# Patient Record
Sex: Female | Born: 1990 | Hispanic: No | Marital: Married | Smoking: Former smoker
Health system: Southern US, Community
[De-identification: ages and names within clinical notes are randomized; demographics above are authoritative.]

## PROBLEM LIST (undated history)

## (undated) ENCOUNTER — Inpatient Hospital Stay (HOSPITAL_COMMUNITY): Payer: Self-pay

## (undated) DIAGNOSIS — F419 Anxiety disorder, unspecified: Secondary | ICD-10-CM

## (undated) DIAGNOSIS — T7840XA Allergy, unspecified, initial encounter: Secondary | ICD-10-CM

## (undated) HISTORY — DX: Allergy, unspecified, initial encounter: T78.40XA

## (undated) HISTORY — PX: OTHER SURGICAL HISTORY: SHX169

## (undated) HISTORY — DX: Anxiety disorder, unspecified: F41.9

## (undated) HISTORY — PX: TONSILLECTOMY: SUR1361

---
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
## 2013-02-08 ENCOUNTER — Ambulatory Visit: Payer: Self-pay | Admitting: Emergency Medicine

## 2013-02-08 VITALS — BP 116/76 | HR 92 | Temp 99.3°F | Resp 18 | Ht 63.5 in | Wt 149.0 lb

## 2013-02-08 DIAGNOSIS — R112 Nausea with vomiting, unspecified: Secondary | ICD-10-CM

## 2013-02-08 LAB — POCT CBC
Granulocyte percent: 66.1 %G (ref 37–80)
HCT, POC: 38.2 % (ref 37.7–47.9)
Hemoglobin: 11.7 g/dL — AB (ref 12.2–16.2)
Lymph, poc: 2.1 (ref 0.6–3.4)
MCH, POC: 25 pg — AB (ref 27–31.2)
MCHC: 30.6 g/dL — AB (ref 31.8–35.4)
MCV: 81.7 fL (ref 80–97)
MID (cbc): 0.4 (ref 0–0.9)
MPV: 9.5 fL (ref 0–99.8)
POC Granulocyte: 4.9 (ref 2–6.9)
POC LYMPH PERCENT: 28.8 %L (ref 10–50)
POC MID %: 5.1 %M (ref 0–12)
Platelet Count, POC: 312 10*3/uL (ref 142–424)
RBC: 4.68 M/uL (ref 4.04–5.48)
RDW, POC: 13.9 %
WBC: 7.4 10*3/uL (ref 4.6–10.2)

## 2013-02-08 LAB — COMPREHENSIVE METABOLIC PANEL
ALT: 11 U/L (ref 0–35)
AST: 15 U/L (ref 0–37)
Albumin: 4.8 g/dL (ref 3.5–5.2)
Alkaline Phosphatase: 65 U/L (ref 39–117)
BUN: 9 mg/dL (ref 6–23)
CO2: 26 mEq/L (ref 19–32)
Calcium: 9.9 mg/dL (ref 8.4–10.5)
Chloride: 105 mEq/L (ref 96–112)
Creat: 0.66 mg/dL (ref 0.50–1.10)
Glucose, Bld: 88 mg/dL (ref 70–99)
Potassium: 4.2 mEq/L (ref 3.5–5.3)
Sodium: 137 mEq/L (ref 135–145)
Total Bilirubin: 1.1 mg/dL (ref 0.3–1.2)
Total Protein: 7.9 g/dL (ref 6.0–8.3)

## 2013-02-08 LAB — POCT URINALYSIS DIPSTICK
Bilirubin, UA: NEGATIVE
Blood, UA: NEGATIVE
Glucose, UA: NEGATIVE
Ketones, UA: NEGATIVE
Leukocytes, UA: NEGATIVE
Nitrite, UA: NEGATIVE
Protein, UA: NEGATIVE
Spec Grav, UA: 1.02
Urobilinogen, UA: 0.2
pH, UA: 8

## 2013-02-08 MED ORDER — ONDANSETRON 8 MG PO TBDP: 8.0000 mg | ORAL_TABLET | Freq: Three times a day (TID) | ORAL | Status: DC | PRN

## 2013-02-08 NOTE — Patient Instructions (Addendum)

## 2013-02-08 NOTE — Progress Notes (Signed)
[facility] [address], [place] [address] [id number]  Date:  02/08/2013   Name:  Emily Wilkins   DOB:  1991-07-08   MRN:  [id number]  PCP:  No PCP Per Patient    Chief Complaint: Nausea and Generalized Body Aches   History of Present Illness:  Emily Wilkins is a 22 y.o. very pleasant female patient who presents with the following:  Ill for two days with RUQ pain and nausea.  Vomited once. No food intolerance.  No diarrhea.  No dysuria, urgency or frequency.  No GYN symptoms.  Not sexually active.  No fever or chills. No food intolerance.  No improvement with over the counter medications or other home remedies. Denies other complaint or health concern today.   There are no active problems to display for this patient.   Past Medical History  Diagnosis Date  . Allergy     Past Surgical History  Procedure Laterality Date  . Tonsilect      History  Substance Use Topics  . Smoking status: Never Smoker   . Smokeless tobacco: Not on file  . Alcohol Use: No    History reviewed. No pertinent family history.  No Known Allergies  Medication list has been reviewed and updated.  No current outpatient prescriptions on file prior to visit.   No current facility-administered medications on file prior to visit.    Review of Systems:  As per HPI, otherwise negative.    Physical Examination: Filed Vitals:   02/08/13 1543  BP: 116/76  Pulse: 92  Temp: 99.3 F (37.4 C)  Resp: 18   Filed Vitals:   02/08/13 1543  Height: 5' 3.5" (1.613 m)  Weight: 149 lb (67.586 kg)   Body mass index is 25.98 kg/(m^2). Ideal Body Weight: Weight in (lb) to have BMI = 25: 143.1  GEN: WDWN, NAD, Non-toxic, A & O x 3  Not icteric HEENT: Atraumatic, Normocephalic. Neck supple. No masses, No LAD. Ears and Nose: No external deformity. CV: RRR, No M/G/R. No JVD. No thrill. No extra heart sounds. PULM: CTA B, no wheezes, crackles, rhonchi. No retractions. No resp.  distress. No accessory muscle use. ABD: S, mild RUQ tenderness, ND, +BS. No rebound. No HSM. EXTR: No c/c/e NEURO Normal gait.  PSYCH: Normally interactive. Conversant. Not depressed or anxious appearing.  Calm demeanor.    Assessment and Plan: Nausea and vomiting.  zofran Labs   Signed,  Phillips Odor, MD   Results for orders placed in visit on 02/08/13  POCT CBC      Result Value Range   WBC 7.4  4.6 - 10.2 K/uL   Lymph, poc 2.1  0.6 - 3.4   POC LYMPH PERCENT 28.8  10 - 50 %L   MID (cbc) 0.4  0 - 0.9   POC MID % 5.1  0 - 12 %M   POC Granulocyte 4.9  2 - 6.9   Granulocyte percent 66.1  37 - 80 %G   RBC 4.68  4.04 - 5.48 M/uL   Hemoglobin 11.7 (*) 12.2 - 16.2 g/dL   HCT, POC 64.4  03.4 - 47.9 %   MCV 81.7  80 - 97 fL   MCH, POC 25.0 (*) 27 - 31.2 pg   MCHC 30.6 (*) 31.8 - 35.4 g/dL   RDW, POC 74.2     Platelet Count, POC 312  142 - 424 K/uL   MPV 9.5  0 - 99.8 fL  POCT URINALYSIS  DIPSTICK      Result Value Range   Color, UA yellow     Clarity, UA clear     Glucose, UA neg     Bilirubin, UA neg     Ketones, UA neg     Spec Grav, UA 1.020     Blood, UA neg     pH, UA 8.0     Protein, UA neg     Urobilinogen, UA 0.2     Nitrite, UA neg     Leukocytes, UA Negative

## 2013-06-20 ENCOUNTER — Emergency Department (HOSPITAL_COMMUNITY): Payer: Self-pay

## 2013-06-20 ENCOUNTER — Emergency Department (HOSPITAL_COMMUNITY)
Admission: EM | Admit: 2013-06-20 | Discharge: 2013-06-20 | Disposition: A | Discharge status: Home / Self Care | Payer: Self-pay | Attending: Emergency Medicine | Admitting: Emergency Medicine

## 2013-06-20 DIAGNOSIS — Z3202 Encounter for pregnancy test, result negative: Secondary | ICD-10-CM | POA: Insufficient documentation

## 2013-06-20 DIAGNOSIS — R42 Dizziness and giddiness: Secondary | ICD-10-CM | POA: Insufficient documentation

## 2013-06-20 DIAGNOSIS — N83209 Unspecified ovarian cyst, unspecified side: Secondary | ICD-10-CM | POA: Insufficient documentation

## 2013-06-20 LAB — CBC WITH DIFFERENTIAL/PLATELET
Basophils Relative: 0 % (ref 0–1)
Eosinophils Absolute: 0 10*3/uL (ref 0.0–0.7)
Eosinophils Relative: 0 % (ref 0–5)
Hemoglobin: 11.5 g/dL — ABNORMAL LOW (ref 12.0–15.0)
Lymphs Abs: 2.2 10*3/uL (ref 0.7–4.0)
MCH: 24.8 pg — ABNORMAL LOW (ref 26.0–34.0)
MCHC: 32.7 g/dL (ref 30.0–36.0)
MCV: 76 fL — ABNORMAL LOW (ref 78.0–100.0)
Monocytes Relative: 5 % (ref 3–12)
Neutrophils Relative %: 64 % (ref 43–77)

## 2013-06-20 LAB — COMPREHENSIVE METABOLIC PANEL
Albumin: 4.3 g/dL (ref 3.5–5.2)
Alkaline Phosphatase: 60 U/L (ref 39–117)
BUN: 11 mg/dL (ref 6–23)
CO2: 26 mEq/L (ref 19–32)
Calcium: 9.7 mg/dL (ref 8.4–10.5)
Creatinine, Ser: 0.64 mg/dL (ref 0.50–1.10)
GFR calc Af Amer: >90 mL/min (ref >90)
GFR calc non Af Amer: >90 mL/min (ref >90)
Potassium: 3.7 mEq/L (ref 3.5–5.1)
Total Bilirubin: 0.7 mg/dL (ref 0.3–1.2)
Total Protein: 8.1 g/dL (ref 6.0–8.3)

## 2013-06-20 LAB — WET PREP, GENITAL
Clue Cells Wet Prep HPF POC: NONE SEEN
Trich, Wet Prep: NONE SEEN
Yeast Wet Prep HPF POC: NONE SEEN

## 2013-06-20 LAB — URINALYSIS, ROUTINE W REFLEX MICROSCOPIC
Bilirubin Urine: NEGATIVE
Glucose, UA: NEGATIVE mg/dL
Hgb urine dipstick: NEGATIVE
Ketones, ur: NEGATIVE mg/dL
Leukocytes, UA: NEGATIVE
Nitrite: NEGATIVE
Protein, ur: NEGATIVE mg/dL
Specific Gravity, Urine: 1.026 (ref 1.005–1.030)
Urobilinogen, UA: 0.2 mg/dL (ref 0.0–1.0)
pH: 6.5 (ref 5.0–8.0)

## 2013-06-20 MED ORDER — ONDANSETRON HCL 4 MG/2ML IJ SOLN
4.0000 mg | Freq: Once | INTRAMUSCULAR | Status: AC
  Administered 2013-06-20: 4 mg via INTRAVENOUS
  Filled 2013-06-20: qty 2

## 2013-06-20 MED ORDER — KETOROLAC TROMETHAMINE 30 MG/ML IJ SOLN
30.0000 mg | Freq: Once | INTRAMUSCULAR | Status: AC
  Administered 2013-06-20: 30 mg via INTRAVENOUS
  Filled 2013-06-20: qty 1

## 2013-06-20 MED ORDER — OXYCODONE-ACETAMINOPHEN 5-325 MG PO TABS: 1.0000 | ORAL_TABLET | Freq: Four times a day (QID) | ORAL | Status: DC | PRN

## 2013-06-20 NOTE — ED Provider Notes (Signed)
CSN: [id number]     Arrival date & time 06/20/13  1825 History   First MD Initiated Contact with Patient 06/20/13 1834     Chief Complaint  Patient presents with  . Pelvic Pain  . Loss of Consciousness   (Consider location/radiation/quality/duration/timing/severity/associated sxs/prior Treatment) HPI  This a 22 year old with no significant past medical history who presents with lower abdominal pain. She reports that it is dull and nonradiating. She denies any urinary symptoms. She rates her pain at 5/10. She states her pain was worse yesterday but got better with pain medication at home. Patient reports having a bowel movement yesterday and she states that while she was on the toilet she got hot and sweaty and then passed out. She has passed out in the past in similar situations. Patient reports no dysuria but does report dark urine. Last missed her period was 2 weeks ago but denies sexual activity.   Past Medical History  Diagnosis Date  . Allergy    Past Surgical History  Procedure Laterality Date  . Tonsilect     No family history on file. History  Substance Use Topics  . Smoking status: Never Smoker   . Smokeless tobacco: Not on file  . Alcohol Use: No   OB History   Grav Para Term Preterm Abortions TAB SAB Ect Mult Living                 Review of Systems  Constitutional: Negative for fever.  Gastrointestinal: Positive for nausea and abdominal pain. Negative for vomiting.  Genitourinary: Negative for dysuria, flank pain and vaginal discharge.  Musculoskeletal: Negative for back pain.  All other systems reviewed and are negative.    Allergies  Review of patient's allergies indicates no known allergies.  Home Medications   Current Outpatient Rx  Name  Route  Sig  Dispense  Refill  . acetaminophen (TYLENOL) 325 MG tablet   Oral   Take 325 mg by mouth every 6 (six) hours as needed for pain (pain).         Marland Kitchen oxyCODONE-acetaminophen (PERCOCET/ROXICET) 5-325 MG  per tablet   Oral   Take 1 tablet by mouth every 6 (six) hours as needed for pain.   10 tablet   0    BP 103/64  Pulse 78  Temp(Src) 98.8 F (37.1 C) (Oral)  SpO2 100% Physical Exam  Nursing note and vitals reviewed. Constitutional: She is oriented to person, place, and time. She appears well-developed and well-nourished. No distress.  HENT:  Head: Normocephalic and atraumatic.  Cardiovascular: Normal rate, regular rhythm and normal heart sounds.   Pulmonary/Chest: Effort normal. No respiratory distress.  Abdominal: Soft. Bowel sounds are normal. She exhibits no distension. There is no tenderness. There is no rebound and no guarding.  Genitourinary:  Normal external vaginal exam, scant vaginal discharge, no cervical motion tenderness, right adnexal tenderness  Neurological: She is alert and oriented to person, place, and time.  Skin: Skin is warm and dry.  Psychiatric: She has a normal mood and affect.    ED Course  Procedures (including critical care time) Labs Review Labs Reviewed  WET PREP, GENITAL - Abnormal; Notable for the following:    WBC, Wet Prep HPF POC RARE (*)    All other components within normal limits  URINALYSIS, ROUTINE W REFLEX MICROSCOPIC - Abnormal; Notable for the following:    APPearance CLOUDY (*)    All other components within normal limits  CBC WITH DIFFERENTIAL - Abnormal; Notable for  the following:    Hemoglobin 11.5 (*)    HCT 35.2 (*)    MCV 76.0 (*)    MCH 24.8 (*)    All other components within normal limits  GC/CHLAMYDIA PROBE AMP  COMPREHENSIVE METABOLIC PANEL  POCT PREGNANCY, URINE   Imaging Review US Transvaginal Non-ob  06/20/2013   CLINICAL DATA:  Loss of consciousness. Negative pregnancy test  EXAM: TRANSVAGINAL ULTRASOUND OF PELVIS  TECHNIQUE: Transvaginal ultrasound examination of the pelvis was performed including evaluation of the uterus, ovaries, adnexal regions, and pelvic cul-de-sac.  COMPARISON:  None.  FINDINGS: Uterus   Measurements: 7.4 x 4.0 x 4.9 cm. No fibroids or other mass visualized.  Endometrium  Thickness: Thickened endometrium 1.9 cm. No focal abnormality visualized.  Right ovary  Measurements: 1.9 cm cyst. Right ovary normal in size. Normal appearance/no adnexal mass.  Left ovary  Measurements: Normal left ovary. Normal appearance/no adnexal mass.  Other findings: Moderate amount of pelvic ascites containing internal echoes suggesting blood or pus. Dilated adnexal veins are present on the left.  IMPRESSION: Moderate free fluid in the pelvis containing internal echoes suggestive of blood or pus.  Thickened endometrium  19 mm right ovarian cyst.   Electronically Signed   By: Marlan Palau M.D.   On: 06/20/2013 22:31    EKG Interpretation   None      Medications  ondansetron (ZOFRAN) injection 4 mg (4 mg Intravenous Given 06/20/13 1927)  ketorolac (TORADOL) 30 MG/ML injection 30 mg (30 mg Intravenous Given 06/20/13 1927)   MDM   1. Hemorrhagic ovarian cyst    This is a 22 year old female who presents with lower pelvic pain, dizziness, and nausea. She did pass out yesterday. She is nontoxic-appearing on exam.  She has mild tenderness to palpation on abdominal exam and right adnexal tenderness GU exam. Patient was given Zofran and Toradol with improvement of her symptoms. She is not orthostatic. Given that she passed out yesterday after having a bowel movement, my suspicion is that she had a vagal episode. She has a history of these in the past. Ultrasound is notable for a right ovarian cyst and free fluid in the pelvis which is likely blood. I shared these results with the patient and her mother. Patient was given OB/GYN followup. She will be given a short course of pain medication. She was advised to return if she has any recurrent syncope, fevers, increasing abdominal pain. Hemoglobin is 11.5.  After history, exam, and medical workup I feel the patient has been appropriately medically screened and is  safe for discharge home. Pertinent diagnoses were discussed with the patient. Patient was given return precautions.    Shon Baton, MD 06/21/13 [phone]

## 2013-06-20 NOTE — ED Notes (Signed)
Pt states that she started having lower pelvic pain yesterday with nausea and dizziness. States that she did pass out and hit the floor. Hurts more when she urinates. Alert and oriented at this time.

## 2013-06-21 LAB — GC/CHLAMYDIA PROBE AMP
CT Probe RNA: NEGATIVE
GC Probe RNA: NEGATIVE

## 2016-02-15 ENCOUNTER — Emergency Department (HOSPITAL_BASED_OUTPATIENT_CLINIC_OR_DEPARTMENT_OTHER)
Admission: EM | Admit: 2016-02-15 | Discharge: 2016-02-15 | Disposition: A | Discharge status: Home / Self Care | Payer: Self-pay | Attending: Emergency Medicine | Admitting: Emergency Medicine

## 2016-02-15 ENCOUNTER — Encounter (HOSPITAL_BASED_OUTPATIENT_CLINIC_OR_DEPARTMENT_OTHER): Payer: Self-pay | Admitting: *Deleted

## 2016-02-15 DIAGNOSIS — F17290 Nicotine dependence, other tobacco product, uncomplicated: Secondary | ICD-10-CM | POA: Insufficient documentation

## 2016-02-15 DIAGNOSIS — R0789 Other chest pain: Secondary | ICD-10-CM | POA: Insufficient documentation

## 2016-02-15 DIAGNOSIS — K219 Gastro-esophageal reflux disease without esophagitis: Secondary | ICD-10-CM | POA: Insufficient documentation

## 2016-02-15 LAB — COMPREHENSIVE METABOLIC PANEL
ALT: 13 U/L — AB (ref 14–54)
AST: 17 U/L (ref 15–41)
Albumin: 4.2 g/dL (ref 3.5–5.0)
Alkaline Phosphatase: 48 U/L (ref 38–126)
Anion gap: 8 (ref 5–15)
BUN: 9 mg/dL (ref 6–20)
CO2: 23 mmol/L (ref 22–32)
CREATININE: 0.47 mg/dL (ref 0.44–1.00)
Calcium: 9 mg/dL (ref 8.9–10.3)
Chloride: 107 mmol/L (ref 101–111)
GFR calc Af Amer: >60 mL/min (ref >60)
Glucose, Bld: 90 mg/dL (ref 65–99)
POTASSIUM: 3.5 mmol/L (ref 3.5–5.1)
Sodium: 138 mmol/L (ref 135–145)
Total Bilirubin: 1 mg/dL (ref 0.3–1.2)
Total Protein: 7.2 g/dL (ref 6.5–8.1)

## 2016-02-15 LAB — CBC WITH DIFFERENTIAL/PLATELET
Basophils Absolute: 0 10*3/uL (ref 0.0–0.1)
Basophils Relative: 0 %
EOS PCT: 1 %
Eosinophils Absolute: 0 10*3/uL (ref 0.0–0.7)
HCT: 35.5 % — ABNORMAL LOW (ref 36.0–46.0)
Hemoglobin: 11.9 g/dL — ABNORMAL LOW (ref 12.0–15.0)
LYMPHS ABS: 1.4 10*3/uL (ref 0.7–4.0)
Lymphocytes Relative: 26 %
MCH: 26.6 pg (ref 26.0–34.0)
MCHC: 33.5 g/dL (ref 30.0–36.0)
MCV: 79.2 fL (ref 78.0–100.0)
MONO ABS: 0.3 10*3/uL (ref 0.1–1.0)
MONOS PCT: 5 %
Neutro Abs: 3.8 10*3/uL (ref 1.7–7.7)
Neutrophils Relative %: 68 %
PLATELETS: 265 10*3/uL (ref 150–400)
RBC: 4.48 MIL/uL (ref 3.87–5.11)
RDW: 13.6 % (ref 11.5–15.5)
WBC: 5.5 10*3/uL (ref 4.0–10.5)

## 2016-02-15 LAB — LIPASE, BLOOD: Lipase: 27 U/L (ref 11–51)

## 2016-02-15 MED ORDER — SUCRALFATE 1 G PO TABS: 1.0000 g | ORAL_TABLET | Freq: Three times a day (TID) | ORAL | Status: DC

## 2016-02-15 MED ORDER — GI COCKTAIL ~~LOC~~
30.0000 mL | Freq: Once | ORAL | Status: AC
  Administered 2016-02-15: 30 mL via ORAL
  Filled 2016-02-15: qty 30

## 2016-02-15 MED ORDER — FAMOTIDINE 20 MG PO TABS
20.0000 mg | ORAL_TABLET | Freq: Once | ORAL | Status: AC
  Administered 2016-02-15: 20 mg via ORAL
  Filled 2016-02-15: qty 1

## 2016-02-15 MED ORDER — OMEPRAZOLE 20 MG PO CPDR: 20.0000 mg | DELAYED_RELEASE_CAPSULE | Freq: Every day | ORAL | Status: DC

## 2016-02-15 MED ORDER — SUCRALFATE 1 G PO TABS
1.0000 g | ORAL_TABLET | Freq: Once | ORAL | Status: AC
  Administered 2016-02-15: 1 g via ORAL
  Filled 2016-02-15: qty 1

## 2016-02-15 NOTE — ED Notes (Addendum)
Per pt report ongoing issues with swallowing, no fever/v/d. Occasional nausea. Pain when eating foods. Points to throat and upper chest.

## 2016-02-15 NOTE — ED Provider Notes (Signed)
CSN: [id number]     Arrival date & time 02/15/16  1513 History   First MD Initiated Contact with Patient 02/15/16 1630     Chief Complaint  Patient presents with  . Gastroesophageal Reflux     (Consider location/radiation/quality/duration/timing/severity/associated sxs/prior Treatment) HPI Emily Wilkins is a 25 y.o. female with hx of Allergies, presents to emergency department complaining of epigastric pain radiating into her chest. Patient states that she has had symptoms for about 3 days. She states pain is worse with eating or swallowing. She states she is able to swallow, however shortly after eating she reports increased pain epigastric area that goes into her chest. She denies any fever or chills. No nausea or vomiting. Denies any change in her bowels. No urinary symptoms. No cough or congestion. No shortness of breath. She has been taking ibuprofen for this pain and for recent headaches that she has been having. She states she takes ibuprofen daily. She also reports heavy caffeine use. She smokes hooka only. She denies alcohol. No history of the same.  Past Medical History  Diagnosis Date  . Allergy    Past Surgical History  Procedure Laterality Date  . Tonsilect    . Tonsillectomy     History reviewed. No pertinent family history. Social History  Substance Use Topics  . Smoking status: Current Every Day Smoker    Types: Pipe  . Smokeless tobacco: None  . Alcohol Use: No   OB History    No data available     Review of Systems  Constitutional: Negative for fever and chills.  Respiratory: Positive for chest tightness. Negative for cough and shortness of breath.   Cardiovascular: Positive for chest pain. Negative for palpitations and leg swelling.  Gastrointestinal: Positive for abdominal pain. Negative for nausea, vomiting and diarrhea.  Genitourinary: Negative for dysuria, flank pain and pelvic pain.  Musculoskeletal: Negative for myalgias, arthralgias, neck pain and neck  stiffness.  Skin: Negative for rash.  Neurological: Negative for dizziness, weakness and headaches.  All other systems reviewed and are negative.     Allergies  Review of patient's allergies indicates no known allergies.  Home Medications   Prior to Admission medications   Not on File   BP 119/79 mmHg  Pulse 80  Temp(Src) 98.2 F (36.8 C) (Oral)  Resp 18  Ht 5\' 4"  (1.626 m)  Wt 60.782 kg  BMI 22.99 kg/m2  SpO2 100%  LMP 02/15/2016 (Exact Date) Physical Exam  Constitutional: She is oriented to person, place, and time. She appears well-developed and well-nourished. No distress.  Eyes: Conjunctivae are normal.  Neck: Neck supple.  Cardiovascular: Normal rate, regular rhythm and normal heart sounds.   Pulmonary/Chest: Effort normal and breath sounds normal. No respiratory distress. She has no wheezes. She has no rales.  Abdominal: Soft. Bowel sounds are normal. She exhibits no distension. There is tenderness. There is no rebound.  Epigastric tenderness  Neurological: She is alert and oriented to person, place, and time.  Skin: Skin is warm and dry.  Nursing note and vitals reviewed.   ED Course  Procedures (including critical care time) Labs Review Labs Reviewed  CBC WITH DIFFERENTIAL/PLATELET - Abnormal; Notable for the following:    Hemoglobin 11.9 (*)    HCT 35.5 (*)    All other components within normal limits  COMPREHENSIVE METABOLIC PANEL - Abnormal; Notable for the following:    ALT 13 (*)    All other components within normal limits  LIPASE, BLOOD  Imaging Review No results found. I have personally reviewed and evaluated these images and lab results as part of my medical decision-making.   EKG Interpretation None      MDM   Final diagnoses:  Gastroesophageal reflux disease, esophagitis presence not specified    Patient with epigastric pain that radiates into her chest, worse after eating. Vital signs are normal. No fever. Mild epigastric  tenderness on exam. Will try GI cocktail and get labs.  Patient had temporary relief of pain with GI cocktail, however pain came back. Her lab work is unremarkable, specifically normal LFTs and lipase. I discussed with her possibility of her symptoms, which includes gastritis, peptic ulcers, esophageal spasms, and gallbladder problems. At this time, I do not think she needs any imaging on emergent basis. Will start on Prilosec, Carafate, stop all NSAID medications. Maalox for acute pain. Follow-up with primary care doctor. Return precautions discussed.  Filed Vitals:   02/15/16 1521 02/15/16 1818  BP: 119/79 117/74  Pulse: 80 62  Temp: 98.2 F (36.8 C)   TempSrc: Oral   Resp: 18 16  Height: 5\' 4"  (1.626 m)   Weight: 60.782 kg   SpO2: 100% 97%    I personally performed the services described in this documentation, which was scribed in my presence. The recorded information has been reviewed and is accurate.     Jeannett Senior, PA-C 02/15/16 2316  Charlesetta Shanks, MD 02/17/16 1756

## 2016-02-15 NOTE — Discharge Instructions (Signed)
Take Tylenol for pain. Avoid any NSAID medication such as ibuprofen, naproxen, aspirin. Take Maalox for acute pain. Take Carafate as prescribed with meals and at bedtime. Take Prilosec daily. Avoid any fatty, spicy foods. Avoid smoking or alcohol. Avoid caffeinated beverages. Follow up with your doctor. Return if worsening.   Gastroesophageal Reflux Disease, Adult Normally, food travels down the esophagus and stays in the stomach to be digested. However, when a person has gastroesophageal reflux disease (GERD), food and stomach acid move back up into the esophagus. When this happens, the esophagus becomes sore and inflamed. Over time, GERD can create small holes (ulcers) in the lining of the esophagus.  CAUSES This condition is caused by a problem with the muscle between the esophagus and the stomach (lower esophageal sphincter, or LES). Normally, the LES muscle closes after food passes through the esophagus to the stomach. When the LES is weakened or abnormal, it does not close properly, and that allows food and stomach acid to go back up into the esophagus. The LES can be weakened by certain dietary substances, medicines, and medical conditions, including:  Tobacco use.  Pregnancy.  Having a hiatal hernia.  Heavy alcohol use.  Certain foods and beverages, such as coffee, chocolate, onions, and peppermint. RISK FACTORS This condition is more likely to develop in:  People who have an increased body weight.  People who have connective tissue disorders.  People who use NSAID medicines. SYMPTOMS Symptoms of this condition include:  Heartburn.  Difficult or painful swallowing.  The feeling of having a lump in the throat.  Abitter taste in the mouth.  Bad breath.  Having a large amount of saliva.  Having an upset or bloated stomach.  Belching.  Chest pain.  Shortness of breath or wheezing.  Ongoing (chronic) cough or a night-time cough.  Wearing away of tooth  enamel.  Weight loss. Different conditions can cause chest pain. Make sure to see your health care provider if you experience chest pain. DIAGNOSIS Your health care provider will take a medical history and perform a physical exam. To determine if you have mild or severe GERD, your health care provider may also monitor how you respond to treatment. You may also have other tests, including:  An endoscopy toexamine your stomach and esophagus with a small camera.  A test thatmeasures the acidity level in your esophagus.  A test thatmeasures how much pressure is on your esophagus.  A barium swallow or modified barium swallow to show the shape, size, and functioning of your esophagus. TREATMENT The goal of treatment is to help relieve your symptoms and to prevent complications. Treatment for this condition may vary depending on how severe your symptoms are. Your health care provider may recommend:  Changes to your diet.  Medicine.  Surgery. HOME CARE INSTRUCTIONS Diet  Follow a diet as recommended by your health care provider. This may involve avoiding foods and drinks such as:  Coffee and tea (with or without caffeine).  Drinks that containalcohol.  Energy drinks and sports drinks.  Carbonated drinks or sodas.  Chocolate and cocoa.  Peppermint and mint flavorings.  Garlic and onions.  Horseradish.  Spicy and acidic foods, including peppers, chili powder, curry powder, vinegar, hot sauces, and barbecue sauce.  Citrus fruit juices and citrus fruits, such as oranges, lemons, and limes.  Tomato-based foods, such as red sauce, chili, salsa, and pizza with red sauce.  Fried and fatty foods, such as donuts, french fries, potato chips, and high-fat dressings.  High-fat  meats, such as hot dogs and fatty cuts of red and white meats, such as rib eye steak, sausage, ham, and bacon.  High-fat dairy items, such as whole milk, butter, and cream cheese.  Eat small, frequent  meals instead of large meals.  Avoid drinking large amounts of liquid with your meals.  Avoid eating meals during the 2-3 hours before bedtime.  Avoid lying down right after you eat.  Do not exercise right after you eat. General Instructions  Pay attention to any changes in your symptoms.  Take over-the-counter and prescription medicines only as told by your health care provider. Do not take aspirin, ibuprofen, or other NSAIDs unless your health care provider told you to do so.  Do not use any tobacco products, including cigarettes, chewing tobacco, and e-cigarettes. If you need help quitting, ask your health care provider.  Wear loose-fitting clothing. Do not wear anything tight around your waist that causes pressure on your abdomen.  Raise (elevate) the head of your bed 6 inches (15cm).  Try to reduce your stress, such as with yoga or meditation. If you need help reducing stress, ask your health care provider.  If you are overweight, reduce your weight to an amount that is healthy for you. Ask your health care provider for guidance about a safe weight loss goal.  Keep all follow-up visits as told by your health care provider. This is important. SEEK MEDICAL CARE IF:  You have new symptoms.  You have unexplained weight loss.  You have difficulty swallowing, or it hurts to swallow.  You have wheezing or a persistent cough.  Your symptoms do not improve with treatment.  You have a hoarse voice. SEEK IMMEDIATE MEDICAL CARE IF:  You have pain in your arms, neck, jaw, teeth, or back.  You feel sweaty, dizzy, or light-headed.  You have chest pain or shortness of breath.  You vomit and your vomit looks like blood or coffee grounds.  You faint.  Your stool is bloody or black.  You cannot swallow, drink, or eat.   This information is not intended to replace advice given to you by your health care provider. Make sure you discuss any questions you have with your health  care provider.   Document Released: 05/28/2005 Document Revised: 05/09/2015 Document Reviewed: 12/13/2014 Elsevier Interactive Patient Education 2016 [place] [facility] for Gastroesophageal Reflux Disease, Adult When you have gastroesophageal reflux disease (GERD), the foods you eat and your eating habits are very important. Choosing the right foods can help ease the discomfort of GERD. WHAT GENERAL GUIDELINES DO I NEED TO FOLLOW?  Choose fruits, vegetables, whole grains, low-fat dairy products, and low-fat meat, fish, and poultry.  Limit fats such as oils, salad dressings, butter, nuts, and avocado.  Keep a food diary to identify foods that cause symptoms.  Avoid foods that cause reflux. These may be different for different people.  Eat frequent small meals instead of three large meals each day.  Eat your meals slowly, in a relaxed setting.  Limit fried foods.  Cook foods using methods other than frying.  Avoid drinking alcohol.  Avoid drinking large amounts of liquids with your meals.  Avoid bending over or lying down until 2-3 hours after eating. WHAT FOODS ARE NOT RECOMMENDED? The following are some foods and drinks that may worsen your symptoms: Vegetables Tomatoes. Tomato juice. Tomato and spaghetti sauce. Chili peppers. Onion and garlic. Horseradish. Fruits Oranges, grapefruit, and lemon (fruit and juice). Meats High-fat meats, fish,  and poultry. This includes hot dogs, ribs, ham, sausage, salami, and bacon. Dairy Whole milk and chocolate milk. Sour cream. Cream. Butter. Ice cream. Cream cheese.  Beverages Coffee and tea, with or without caffeine. Carbonated beverages or energy drinks. Condiments Hot sauce. Barbecue sauce.  Sweets/Desserts Chocolate and cocoa. Donuts. Peppermint and spearmint. Fats and Oils High-fat foods, including [place] fries and potato chips. Other Vinegar. Strong spices, such as black pepper, white pepper, red pepper, cayenne,  curry powder, cloves, ginger, and chili powder. The items listed above may not be a complete list of foods and beverages to avoid. Contact your dietitian for more information.   This information is not intended to replace advice given to you by your health care provider. Make sure you discuss any questions you have with your health care provider.   Document Released: 08/18/2005 Document Revised: 09/08/2014 Document Reviewed: 06/22/2013 Elsevier Interactive Patient Education [employer].

## 2016-07-22 ENCOUNTER — Encounter (HOSPITAL_COMMUNITY): Payer: Self-pay | Admitting: *Deleted

## 2016-07-22 ENCOUNTER — Emergency Department (HOSPITAL_COMMUNITY)
Admission: EM | Admit: 2016-07-22 | Discharge: 2016-07-22 | Disposition: A | Discharge status: Home / Self Care | Payer: Self-pay | Attending: Emergency Medicine | Admitting: Emergency Medicine

## 2016-07-22 DIAGNOSIS — K0889 Other specified disorders of teeth and supporting structures: Secondary | ICD-10-CM

## 2016-07-22 DIAGNOSIS — F17200 Nicotine dependence, unspecified, uncomplicated: Secondary | ICD-10-CM | POA: Insufficient documentation

## 2016-07-22 DIAGNOSIS — K029 Dental caries, unspecified: Secondary | ICD-10-CM | POA: Insufficient documentation

## 2016-07-22 MED ORDER — NAPROXEN 375 MG PO TABS: 375.0000 mg | ORAL_TABLET | Freq: Two times a day (BID) | ORAL | 0 refills | Status: DC

## 2016-07-22 MED ORDER — PENICILLIN V POTASSIUM 500 MG PO TABS: 500.0000 mg | ORAL_TABLET | Freq: Three times a day (TID) | ORAL | 0 refills | Status: DC

## 2016-07-22 NOTE — ED Provider Notes (Signed)
[facility] DEPT Provider Note    By signing my name below, I, Bea Graff, attest that this documentation has been prepared under the direction and in the presence of Etta Quill, [place]. Electronically Signed: Bea Graff, ED Scribe. 07/22/16. 8:14 PM.    History   Chief Complaint Chief Complaint  Patient presents with  . Dental Pain   The history is provided by the patient and medical records. No language interpreter was used.    HPI Comments:  Emily Wilkins is a 25 y.o. female who presents to the Emergency Department complaining of left upper dental pain that began about one month ago. She states the pain began to worsen over the past few days. She has not taken anything for pain. She denies modifying factors. She denies fever, chills, nausea, vomiting, difficulty swallowing or breathing. Pt reports her dentist is Jorene Guest but she has not seen him in a long time.   Past Medical History:  Diagnosis Date  . Allergy     There are no active problems to display for this patient.   Past Surgical History:  Procedure Laterality Date  . tonsilect    . TONSILLECTOMY      OB History    No data available       Home Medications    Prior to Admission medications   Medication Sig Start Date End Date Taking? Authorizing Provider  omeprazole (PRILOSEC) 20 MG capsule Take 1 capsule (20 mg total) by mouth daily. 02/15/16   Tatyana Kirichenko, PA-C  sucralfate (CARAFATE) 1 g tablet Take 1 tablet (1 g total) by mouth 4 (four) times daily -  with meals and at bedtime. 02/15/16   Jeannett Senior, PA-C    Family History No family history on file.  Social History Social History  Substance Use Topics  . Smoking status: Current Every Day Smoker    Types: Pipe  . Smokeless tobacco: Never Used  . Alcohol use No     Allergies   Patient has no known allergies.   Review of Systems Review of Systems  Constitutional: Negative for chills and fever.  HENT: Positive  for dental problem. Negative for trouble swallowing.   Respiratory: Negative for shortness of breath.   Gastrointestinal: Negative for nausea and vomiting.  All other systems reviewed and are negative.    Physical Exam Updated Vital Signs BP 122/85 (BP Location: Left Arm)   Pulse 72   Temp 98.7 F (37.1 C) (Oral)   Resp 18   SpO2 100%   Physical Exam  Constitutional: She is oriented to person, place, and time. She appears well-developed and well-nourished.  HENT:  Head: Normocephalic and atraumatic.  Mouth/Throat: Uvula is midline, oropharynx is clear and moist and mucous membranes are normal. No trismus in the jaw. Dental caries present.  Multiple teeth with obvious cavities. First premolar on upper left side with tenderness to palpation.  Neck: Normal range of motion.  Cardiovascular: Normal rate.   Pulmonary/Chest: Effort normal.  Musculoskeletal: Normal range of motion.  Neurological: She is alert and oriented to person, place, and time.  Skin: Skin is warm and dry.  Psychiatric: She has a normal mood and affect. Her behavior is normal.  Nursing note and vitals reviewed.    ED Treatments / Results  DIAGNOSTIC STUDIES: Oxygen Saturation is 100% on RA, normal by my interpretation.   COORDINATION OF CARE: 8:11 PM- Will prescribe antibiotic and NSAID. Advised pt to follow up with a dentist. Pt verbalizes understanding and agrees  to plan.  Medications - No data to display  Labs (all labs ordered are listed, but only abnormal results are displayed) Labs Reviewed - No data to display  EKG  EKG Interpretation None       Radiology No results found.  Procedures Procedures (including critical care time)  Medications Ordered in ED Medications - No data to display   Initial Impression / Assessment and Plan / ED Course  I have reviewed the triage vital signs and the nursing notes.  Pertinent labs & imaging results that were available during my care of the  patient were reviewed by me and considered in my medical decision making (see chart for details).  Clinical Course     Patient with dentalgia.  No abscess requiring immediate incision and drainage.  Exam not concerning for Ludwig's angina or pharyngeal abscess.  Will treat with PCN and Naproxen. Pt instructed to follow-up with dentist.  Discussed return precautions. Pt safe for discharge.  I personally performed the services described in this documentation, which was scribed in my presence. The recorded information has been reviewed and is accurate.   Final Clinical Impressions(s) / ED Diagnoses   Final diagnoses:  Dentalgia    New Prescriptions Discharge Medication List as of 07/22/2016  8:16 PM    START taking these medications   Details  naproxen (NAPROSYN) 375 MG tablet Take 1 tablet (375 mg total) by mouth 2 (two) times daily., Starting Tue 07/22/2016, Print    penicillin v potassium (VEETID) 500 MG tablet Take 1 tablet (500 mg total) by mouth 3 (three) times daily., Starting Tue 07/22/2016, Print         Etta Quill, NP 07/22/16 2110    Orlie Dakin, MD 07/23/16 [id number]

## 2016-07-22 NOTE — ED Triage Notes (Signed)
Pt reports L upper dental pain x 1 month.  Does not have a dentist d/t lack of insurance.  Pt reports last night, pain became severe that it caused her to have migraine h/a.  Does not know if she has a bad tooth.  Pt is A&O x 4.  No obvious neuro deficits noted.

## 2016-07-29 ENCOUNTER — Encounter (HOSPITAL_COMMUNITY): Payer: Self-pay

## 2016-07-29 ENCOUNTER — Emergency Department (HOSPITAL_COMMUNITY)
Admission: EM | Admit: 2016-07-29 | Discharge: 2016-07-29 | Disposition: A | Discharge status: Home / Self Care | Payer: Self-pay | Attending: Emergency Medicine | Admitting: Emergency Medicine

## 2016-07-29 DIAGNOSIS — K0889 Other specified disorders of teeth and supporting structures: Secondary | ICD-10-CM | POA: Insufficient documentation

## 2016-07-29 DIAGNOSIS — F17290 Nicotine dependence, other tobacco product, uncomplicated: Secondary | ICD-10-CM | POA: Insufficient documentation

## 2016-07-29 DIAGNOSIS — Z79899 Other long term (current) drug therapy: Secondary | ICD-10-CM | POA: Insufficient documentation

## 2016-07-29 MED ORDER — TRAMADOL HCL 50 MG PO TABS: 50.0000 mg | ORAL_TABLET | Freq: Four times a day (QID) | ORAL | 0 refills | Status: DC | PRN

## 2016-07-29 NOTE — ED Triage Notes (Signed)
Pt presents with c/o dental pain that she reports has been going on for approx one month. Pt reports that she was seen last week for the same thing but the pain has been worsening since then. Pt reports the pain is in the upper left side of her mouth. Pt reports the pain has become so bad that she has fainted and become nauseated from the pain.

## 2016-07-29 NOTE — Discharge Instructions (Signed)
Please call and follow up closely with a dentist today for further evaluation and management of your dental pain.  Take Tramadol as needed for pain.

## 2016-07-29 NOTE — ED Notes (Signed)
ED Provider at bedside. 

## 2016-07-29 NOTE — ED Provider Notes (Signed)
[facility] DEPT Provider Note   CSN: [id number] Arrival date & time: 07/29/16  1020     History   Chief Complaint Chief Complaint  Patient presents with  . Dental Pain    HPI Emily Wilkins is a 25 y.o. female.  HPI   25 year old female presents for evaluation of dental pain. Patient reports for the past 4 weeks she has had intermittent pain to her left upper dentition which has become increasingly more frequent and within the past 4-5 days it has been persistent. States she was having difficulty sleeping last night due to pain. Pain sometimes severe enough that she feels like fainting. She described pain as a pulsating throbbing sensation occasionally worse with chewing and with temperature changes. She has tried over-the-counter medication, using heating pad, and was also seen in the ED several days prior for her complaint. She was prescribed antibiotic and naproxen which has not helped. She denies any associated fever, throat swelling, hearing changes, popping in her jaw, neck pain, or rash. She does not have a Pharmacist, community.  Past Medical History:  Diagnosis Date  . Allergy     There are no active problems to display for this patient.   Past Surgical History:  Procedure Laterality Date  . tonsilect    . TONSILLECTOMY      OB History    No data available       Home Medications    Prior to Admission medications   Medication Sig Start Date End Date Taking? Authorizing Provider  naproxen (NAPROSYN) 375 MG tablet Take 1 tablet (375 mg total) by mouth 2 (two) times daily. 07/22/16   Etta Quill, NP  omeprazole (PRILOSEC) 20 MG capsule Take 1 capsule (20 mg total) by mouth daily. 02/15/16   Tatyana Kirichenko, PA-C  penicillin v potassium (VEETID) 500 MG tablet Take 1 tablet (500 mg total) by mouth 3 (three) times daily. 07/22/16   Etta Quill, NP  sucralfate (CARAFATE) 1 g tablet Take 1 tablet (1 g total) by mouth 4 (four) times daily -  with meals and at bedtime. 02/15/16    Jeannett Senior, PA-C    Family History No family history on file.  Social History Social History  Substance Use Topics  . Smoking status: Current Every Day Smoker    Types: Pipe  . Smokeless tobacco: Never Used  . Alcohol use No     Allergies   Patient has no known allergies.   Review of Systems Review of Systems  Constitutional: Negative for fever.  HENT: Positive for dental problem. Negative for sinus pain.   Respiratory: Negative for shortness of breath.   Gastrointestinal: Negative for abdominal pain.  Neurological: Negative for headaches.     Physical Exam Updated Vital Signs BP 122/84 (BP Location: Right Arm)   Pulse 78   Temp 98.2 F (36.8 C) (Oral)   Resp 18   Ht 5\' 3"  (1.6 m)   Wt 59.9 kg   LMP 07/18/2016 (Approximate)   SpO2 97%   BMI 23.38 kg/m   Physical Exam  Constitutional: She appears well-developed and well-nourished. No distress.  HENT:  Head: Atraumatic.  Right Ear: External ear normal.  Left Ear: External ear normal.  Mouth: Good dentition, no obvious dental decay. Mild tenderness noted to tooth #12 and 13 on palpation without any surrounding gingival erythema or edema. No trismus. No TMJ.  Eyes: Conjunctivae are normal.  Neck: Neck supple.  Lymphadenopathy:    She has no cervical adenopathy.  Neurological: She is  alert.  Skin: No rash noted.  Psychiatric: She has a normal mood and affect.  Nursing note and vitals reviewed.    ED Treatments / Results  Labs (all labs ordered are listed, but only abnormal results are displayed) Labs Reviewed - No data to display  EKG  EKG Interpretation None       Radiology No results found.  Procedures Procedures (including critical care time)  Medications Ordered in ED Medications - No data to display   Initial Impression / Assessment and Plan / ED Course  I have reviewed the triage vital signs and the nursing notes.  Pertinent labs & imaging results that were available  during my care of the patient were reviewed by me and considered in my medical decision making (see chart for details).  Clinical Course     BP 122/84 (BP Location: Right Arm)   Pulse 78   Temp 98.2 F (36.8 C) (Oral)   Resp 18   Ht 5\' 3"  (1.6 m)   Wt 59.9 kg   LMP 07/18/2016 (Approximate)   SpO2 97%   BMI 23.38 kg/m    Final Clinical Impressions(s) / ED Diagnoses   Final diagnoses:  Pain, dental    New Prescriptions New Prescriptions   No medications on file   11:25 AM Patient has recurrent dental pain for a month. No obvious dental decay on my initial exam. No symptoms to suggest deep tissue infection such as Ludwig's Angina.  I encourage patient to follow-up with a dentist promptly for further evaluation of her condition. She recently received antibiotic, therefore I do not think additional antibiotics will be beneficial at this time.   Domenic Moras, PA-C 07/29/16 1128    Milton Ferguson, MD 07/30/16 1723

## 2017-01-10 ENCOUNTER — Emergency Department (HOSPITAL_BASED_OUTPATIENT_CLINIC_OR_DEPARTMENT_OTHER)
Admission: EM | Admit: 2017-01-10 | Discharge: 2017-01-10 | Disposition: A | Discharge status: Home / Self Care | Payer: Self-pay | Attending: Emergency Medicine | Admitting: Emergency Medicine

## 2017-01-10 ENCOUNTER — Emergency Department (HOSPITAL_BASED_OUTPATIENT_CLINIC_OR_DEPARTMENT_OTHER): Payer: Self-pay

## 2017-01-10 ENCOUNTER — Encounter (HOSPITAL_BASED_OUTPATIENT_CLINIC_OR_DEPARTMENT_OTHER): Payer: Self-pay | Admitting: Emergency Medicine

## 2017-01-10 DIAGNOSIS — B9789 Other viral agents as the cause of diseases classified elsewhere: Secondary | ICD-10-CM

## 2017-01-10 DIAGNOSIS — F17200 Nicotine dependence, unspecified, uncomplicated: Secondary | ICD-10-CM | POA: Insufficient documentation

## 2017-01-10 DIAGNOSIS — J069 Acute upper respiratory infection, unspecified: Secondary | ICD-10-CM | POA: Insufficient documentation

## 2017-01-10 DIAGNOSIS — J40 Bronchitis, not specified as acute or chronic: Secondary | ICD-10-CM | POA: Insufficient documentation

## 2017-01-10 MED ORDER — ALBUTEROL SULFATE HFA 108 (90 BASE) MCG/ACT IN AERS
2.0000 | INHALATION_SPRAY | Freq: Once | RESPIRATORY_TRACT | Status: AC
  Administered 2017-01-10: 2 via RESPIRATORY_TRACT
  Filled 2017-01-10: qty 6.7

## 2017-01-10 MED ORDER — BENZONATATE 100 MG PO CAPS: 100.0000 mg | ORAL_CAPSULE | Freq: Three times a day (TID) | ORAL | 0 refills | Status: DC

## 2017-01-10 NOTE — ED Triage Notes (Signed)
Cough x 1 week getting worse with chest congestion

## 2017-01-10 NOTE — Discharge Instructions (Signed)
You may take albuterol 2 puffs every 4-6 hours as needed for cough or wheezing.

## 2017-01-10 NOTE — ED Provider Notes (Signed)
[facility] Provider Note   CSN: [id number] Arrival date & time: 01/10/17  0920     History   Chief Complaint Chief Complaint  Patient presents with  . Cough    HPI Emily Wilkins is a 26 y.o. female.  HPI  Cough began last Sunday, comes in episodes which are severe. Coughing up clear thick sputum. Tried humidifier. No fevers.  Both cough and dyspnea worse at night. Yesterday was coughing and couldn't stop and was wheezing.  No known hx of asthma, no prior hx of using bronchodilators.  No smoking hx.  Severe nasal congestion and running.  Sore throat sometimes. No ear pain.  No leg pain or swelling.  No hx of taking OCPs.  Had childhood vaccinations.   Past Medical History:  Diagnosis Date  . Allergy     There are no active problems to display for this patient.   Past Surgical History:  Procedure Laterality Date  . tonsilect    . TONSILLECTOMY      OB History    No data available       Home Medications    Prior to Admission medications   Medication Sig Start Date End Date Taking? Authorizing Provider  benzonatate (TESSALON) 100 MG capsule Take 1 capsule (100 mg total) by mouth every 8 (eight) hours. 01/10/17   Gareth Morgan, MD  naproxen (NAPROSYN) 375 MG tablet Take 1 tablet (375 mg total) by mouth 2 (two) times daily. 07/22/16   Etta Quill, NP    Family History History reviewed. No pertinent family history.  Social History Social History  Substance Use Topics  . Smoking status: Current Every Day Smoker    Types: Pipe  . Smokeless tobacco: Never Used  . Alcohol use No     Allergies   Patient has no known allergies.   Review of Systems Review of Systems  Constitutional: Negative for fever.  HENT: Positive for congestion, rhinorrhea and sore throat.   Respiratory: Positive for cough, shortness of breath and wheezing.   Cardiovascular: Positive for chest pain (with cough feels like burning).  Gastrointestinal: Negative for  abdominal pain, diarrhea (one-two episodes), nausea and vomiting.  Genitourinary: Negative for difficulty urinating and dysuria.  Skin: Negative for rash.  Neurological: Negative for syncope and headaches.     Physical Exam Updated Vital Signs BP 117/73 (BP Location: Right Arm)   Pulse 75   Temp 98.4 F (36.9 C) (Oral)   Resp 14   Ht 5\' 3"  (1.6 m)   Wt 143 lb (64.9 kg)   LMP 12/26/2016   SpO2 100%   BMI 25.33 kg/m   Physical Exam  Constitutional: She is oriented to person, place, and time. She appears well-developed and well-nourished. No distress.  HENT:  Head: Normocephalic and atraumatic.  Eyes: Conjunctivae and EOM are normal.  Neck: Normal range of motion.  Cardiovascular: Normal rate, regular rhythm, normal heart sounds and intact distal pulses.  Exam reveals no gallop and no friction rub.   No murmur heard. Pulmonary/Chest: Effort normal and breath sounds normal. No respiratory distress. She has no wheezes. She has no rales.  Abdominal: Soft. She exhibits no distension. There is no tenderness. There is no guarding.  Musculoskeletal: She exhibits no edema or tenderness.  Neurological: She is alert and oriented to person, place, and time.  Skin: Skin is warm and dry. No rash noted. She is not diaphoretic. No erythema.  Nursing note and vitals reviewed.    ED Treatments / Results  Labs (all labs ordered are listed, but only abnormal results are displayed) Labs Reviewed - No data to display  EKG  EKG Interpretation None       Radiology Dg Chest 2 View  Result Date: 01/10/2017 CLINICAL DATA:  26 year-old female c/o productive cough w/ clear sputum since Sunday. Pt states it worsened recently to "a whooping cough" EXAM: CHEST  2 VIEW COMPARISON:  None. FINDINGS: The heart size and mediastinal contours are within normal limits. Both lungs are clear. The visualized skeletal structures are unremarkable. IMPRESSION: No active cardiopulmonary disease. No evidence of  pneumonia or pulmonary edema. Electronically Signed   By: Stan  Maynard M.D.   On: 01/10/2017 10:24    Procedures Procedures (including critical care time)  Medications Ordered in ED Medications  albuterol (PROVENTIL HFA;VENTOLIN HFA) 108 (90 Base) MCG/ACT inhaler 2 puff (2 puffs Inhalation Given 01/10/17 1103)     Initial Impression / Assessment and Plan / ED Course  I have reviewed the triage vital signs and the nursing notes.  Pertinent labs & imaging results that were available during my care of the patient were reviewed by me and considered in my medical decision making (see chart for details).     25 yo female presents with concern for cough for one week, worse at night.  XR without acute abnormalities, no pulmonary edema, no pneumonia.  Given cough worse at night, sensation of wheezing, feel trial of albuterol appropriate.  Pt with likely viral bronchitis/URI, possible allergies as contributor. Given rx for tessalon. Patient discharged in stable condition with understanding of reasons to return.   Final Clinical Impressions(s) / ED Diagnoses   Final diagnoses:  Bronchitis  Viral URI with cough    New Prescriptions Discharge Medication List as of 01/10/2017 10:59 AM    START taking these medications   Details  benzonatate (TESSALON) 100 MG capsule Take 1 capsule (100 mg total) by mouth every 8 (eight) hours., Starting Sat 01/10/2017, Print         Gareth Morgan, MD 01/10/17 2159

## 2018-04-15 ENCOUNTER — Other Ambulatory Visit (HOSPITAL_COMMUNITY)
Admission: RE | Admit: 2018-04-15 | Discharge: 2018-04-15 | Disposition: A | Discharge status: Home / Self Care | Payer: Medicaid Other | Source: Ambulatory Visit | Attending: Obstetrics and Gynecology | Admitting: Obstetrics and Gynecology

## 2018-04-15 ENCOUNTER — Ambulatory Visit (INDEPENDENT_AMBULATORY_CARE_PROVIDER_SITE_OTHER): Payer: Self-pay | Admitting: Obstetrics and Gynecology

## 2018-04-15 ENCOUNTER — Encounter: Payer: Self-pay | Admitting: Obstetrics and Gynecology

## 2018-04-15 ENCOUNTER — Other Ambulatory Visit: Payer: Self-pay

## 2018-04-15 VITALS — BP 100/68 | HR 66 | Ht 64.0 in | Wt 118.0 lb

## 2018-04-15 DIAGNOSIS — Z124 Encounter for screening for malignant neoplasm of cervix: Secondary | ICD-10-CM

## 2018-04-15 DIAGNOSIS — N760 Acute vaginitis: Secondary | ICD-10-CM

## 2018-04-15 DIAGNOSIS — B373 Candidiasis of vulva and vagina: Secondary | ICD-10-CM

## 2018-04-15 DIAGNOSIS — Z01419 Encounter for gynecological examination (general) (routine) without abnormal findings: Secondary | ICD-10-CM

## 2018-04-15 DIAGNOSIS — B3731 Acute candidiasis of vulva and vagina: Secondary | ICD-10-CM

## 2018-04-15 DIAGNOSIS — B9689 Other specified bacterial agents as the cause of diseases classified elsewhere: Secondary | ICD-10-CM

## 2018-04-15 DIAGNOSIS — Z3169 Encounter for other general counseling and advice on procreation: Secondary | ICD-10-CM

## 2018-04-15 MED ORDER — FLUCONAZOLE 150 MG PO TABS: 150.0000 mg | ORAL_TABLET | Freq: Once | ORAL | 0 refills | Status: AC

## 2018-04-15 MED ORDER — BETAMETHASONE VALERATE 0.1 % EX OINT: TOPICAL_OINTMENT | CUTANEOUS | 0 refills | Status: DC

## 2018-04-15 MED ORDER — IBUPROFEN 800 MG PO TABS
800.0000 mg | ORAL_TABLET | Freq: Three times a day (TID) | ORAL | 1 refills | Status: DC | PRN
Start: 2018-04-15 — End: 2018-10-27

## 2018-04-15 MED ORDER — METRONIDAZOLE 500 MG PO TABS: 500.0000 mg | ORAL_TABLET | Freq: Two times a day (BID) | ORAL | 0 refills | Status: DC

## 2018-04-15 NOTE — Patient Instructions (Addendum)
Breast Self-Awareness Breast self-awareness means being familiar with how your breasts look and feel. It involves checking your breasts regularly and reporting any changes to your health care provider. Practicing breast self-awareness is important. A change in your breasts can be a sign of a serious medical problem. Being familiar with how your breasts look and feel allows you to find any problems early, when treatment is more likely to be successful. All women should practice breast self-awareness, including women who have had breast implants. How to do a breast self-exam One way to learn what is normal for your breasts and whether your breasts are changing is to do a breast self-exam. To do a breast self-exam: Look for Changes  1. Remove all the clothing above your waist. 2. Stand in front of a mirror in a room with good lighting. 3. Put your hands on your hips. 4. Push your hands firmly downward. 5. Compare your breasts in the mirror. Look for differences between them (asymmetry), such as: ? Differences in shape. ? Differences in size. ? Puckers, dips, and bumps in one breast and not the other. 6. Look at each breast for changes in your skin, such as: ? Redness. ? Scaly areas. 7. Look for changes in your nipples, such as: ? Discharge. ? Bleeding. ? Dimpling. ? Redness. ? A change in position. Feel for Changes  Carefully feel your breasts for lumps and changes. It is best to do this while lying on your back on the floor and again while sitting or standing in the shower or tub with soapy water on your skin. Feel each breast in the following way:  Place the arm on the side of the breast you are examining above your head.  Feel your breast with the other hand.  Start in the nipple area and make  inch (2 cm) overlapping circles to feel your breast. Use the pads of your three middle fingers to do this. Apply light pressure, then medium pressure, then firm pressure. The light pressure  will allow you to feel the tissue closest to the skin. The medium pressure will allow you to feel the tissue that is a little deeper. The firm pressure will allow you to feel the tissue close to the ribs.  Continue the overlapping circles, moving downward over the breast until you feel your ribs below your breast.  Move one finger-width toward the center of the body. Continue to use the  inch (2 cm) overlapping circles to feel your breast as you move slowly up toward your collarbone.  Continue the up and down exam using all three pressures until you reach your armpit.  Write Down What You Find  Write down what is normal for each breast and any changes that you find. Keep a written record with breast changes or normal findings for each breast. By writing this information down, you do not need to depend only on memory for size, tenderness, or location. Write down where you are in your menstrual cycle, if you are still menstruating. If you are having trouble noticing differences in your breasts, do not get discouraged. With time you will become more familiar with the variations in your breasts and more comfortable with the exam. How often should I examine my breasts? Examine your breasts every month. If you are breastfeeding, the best time to examine your breasts is after a feeding or after using a breast pump. If you menstruate, the best time to examine your breasts is 5-7 days after your  period is over. During your period, your breasts are lumpier, and it may be more difficult to notice changes. When should I see my health care provider? See your health care provider if you notice:  A change in shape or size of your breasts or nipples.  A change in the skin of your breast or nipples, such as a reddened or scaly area.  Unusual discharge from your nipples.  A lump or thick area that was not there before.  Pain in your breasts.  Anything that concerns you.  This information is not intended  to replace advice given to you by your health care provider. Make sure you discuss any questions you have with your health care provider. Document Released: 08/18/2005 Document Revised: 01/24/2016 Document Reviewed: 07/08/2015 Elsevier Interactive Patient Education  2018 [employer]. Preparing for Pregnancy If you are considering becoming pregnant, make an appointment to see your regular health care provider to learn how to prepare for a safe and healthy pregnancy (preconception care). During a preconception care visit, your health care provider will:  Do a complete physical exam, including a Pap test.  Take a complete medical history.  Give you information, answer your questions, and help you resolve problems.  Preconception checklist Medical history  Tell your health care provider about any current or past medical conditions. Your pregnancy or your ability to become pregnant may be affected by chronic conditions, such as diabetes, chronic hypertension, and thyroid problems.  Include your family's medical history as well as your partner's medical history.  Tell your health care provider about any history of STIs (sexually transmitted infections).These can affect your pregnancy. In some cases, they can be passed to your baby. Discuss any concerns that you have about STIs.  If indicated, discuss the benefits of genetic testing. This testing will show whether there are any genetic conditions that may be passed from you or your partner to your baby.  Tell your health care provider about: ? Any problems you have had with conception or pregnancy. ? Any medicines you take. These include vitamins, herbal supplements, and over-the-counter medicines. ? Your history of immunizations. Discuss any vaccinations that you may need.  Diet  Ask your health care provider what to include in a healthy diet that has a balance of nutrients. This is especially important when you are pregnant or preparing  to become pregnant.  Ask your health care provider to help you reach a healthy weight before pregnancy. ? If you are overweight, you may be at higher risk for certain complications, such as high blood pressure, diabetes, and preterm birth. ? If you are underweight, you are more likely to have a baby who has a low birth weight.  Lifestyle, work, and home  Let your health care provider know: ? About any lifestyle habits that you have, such as alcohol use, drug use, or smoking. ? About recreational activities that may put you at risk during pregnancy, such as downhill skiing and certain exercise programs. ? Tell your health care provider about any international travel, especially any travel to places with an active [place] virus outbreak. ? About harmful substances that you may be exposed to at work or at home. These include chemicals, pesticides, radiation, or even litter boxes. ? If you do not feel safe at home.  Mental health  Tell your health care provider about: ? Any history of mental health conditions, including feelings of depression, sadness, or anxiety. ? Any medicines that you take for a mental health  condition. These include herbs and supplements.  Home instructions to prepare for pregnancy Lifestyle  Eat a balanced diet. This includes fresh fruits and vegetables, whole grains, lean meats, low-fat dairy products, healthy fats, and foods that are high in fiber. Ask to meet with a nutritionist or registered dietitian for assistance with meal planning and goals.  Get regular exercise. Try to be active for at least 30 minutes a day on most days of the week. Ask your health care provider which activities are safe during pregnancy.  Do not use any products that contain nicotine or tobacco, such as cigarettes and e-cigarettes. If you need help quitting, ask your health care provider.  Do not drink alcohol.  Do not take illegal drugs.  Maintain a healthy weight. Ask your health care  provider what weight range is right for you.  General instructions  Keep an accurate record of your menstrual periods. This makes it easier for your health care provider to determine your baby's due date.  Begin taking prenatal vitamins and folic acid supplements daily as directed by your health care provider.  Manage any chronic conditions, such as high blood pressure and diabetes, as told by your health care provider. This is important.  How do I know that I am pregnant? You may be pregnant if you have been sexually active and you miss your period. Symptoms of early pregnancy include:  Mild cramping.  Very light vaginal bleeding (spotting).  Feeling unusually tired.  Nausea and vomiting (morning sickness).  If you have any of these symptoms and you suspect that you might be pregnant, you can take a home pregnancy test. These tests check for a hormone in your urine (human chorionic gonadotropin, or hCG). A woman's body begins to make this hormone during early pregnancy. These tests are very accurate. Wait until at least the first day after you miss your period to take one. If the test shows that you are pregnant (you get a positive result), call your health care provider to make an appointment for prenatal care. What should I do if I become pregnant?  Make an appointment with your health care provider as soon as you suspect you are pregnant.  Do not use any products that contain nicotine, such as cigarettes, chewing tobacco, and e-cigarettes. If you need help quitting, ask your health care provider.  Do not drink alcoholic beverages. Alcohol is related to a number of birth defects.  Avoid toxic odors and chemicals.  You may continue to have sexual intercourse if it does not cause pain or other problems, such as vaginal bleeding. This information is not intended to replace advice given to you by your health care provider. Make sure you discuss any questions you have with your health  care provider. Document Released: 07/31/2008 Document Revised: 04/15/2016 Document Reviewed: 03/09/2016 Elsevier Interactive Patient Education  2018 [place] AND DIET:  We recommended that you start or continue a regular exercise program for good health. Regular exercise means any activity that makes your heart beat faster  Vaginal Yeast infection, Adult Vaginal yeast infection is a condition that causes soreness, swelling, and redness (inflammation) of the vagina. It also causes vaginal discharge. This is a common condition. Some women get this infection frequently. What are the causes? This condition is caused by a change in the normal balance of the yeast (candida) and bacteria that live in the vagina. This change causes an overgrowth of yeast, which causes the inflammation. What increases the risk? This  condition is more likely to develop in:  Women who take antibiotic medicines.  Women who have diabetes.  Women who take birth control pills.  Women who are pregnant.  Women who douche often.  Women who have a weak defense (immune) system.  Women who have been taking steroid medicines for a long time.  Women who frequently wear tight clothing.  What are the signs or symptoms? Symptoms of this condition include:  White, thick vaginal discharge.  Swelling, itching, redness, and irritation of the vagina. The lips of the vagina (vulva) may be affected as well.  Pain or a burning feeling while urinating.  Pain during sex.  How is this diagnosed? This condition is diagnosed with a medical history and physical exam. This will include a pelvic exam. Your health care provider will examine a sample of your vaginal discharge under a microscope. Your health care provider may send this sample for testing to confirm the diagnosis. How is this treated? This condition is treated with medicine. Medicines may be over-the-counter or prescription. You may be told to use one or  more of the following:  Medicine that is taken orally.  Medicine that is applied as a cream.  Medicine that is inserted directly into the vagina (suppository).  Follow these instructions at home:  Take or apply over-the-counter and prescription medicines only as told by your health care provider.  Do not have sex until your health care provider has approved. Tell your sex partner that you have a yeast infection. That person should go to his or her health care provider if he or she develops symptoms.  Do not wear tight clothes, such as pantyhose or tight pants.  Avoid using tampons until your health care provider approves.  Eat more yogurt. This may help to keep your yeast infection from returning.  Try taking a sitz bath to help with discomfort. This is a warm water bath that is taken while you are sitting down. The water should only come up to your hips and should cover your buttocks. Do this 3-4 times per day or as told by your health care provider.  Do not douche.  Wear breathable, cotton underwear.  If you have diabetes, keep your blood sugar levels under control. Contact a health care provider if:  You have a fever.  Your symptoms go away and then return.  Your symptoms do not get better with treatment.  Your symptoms get worse.  You have new symptoms.  You develop blisters in or around your vagina.  You have blood coming from your vagina and it is not your menstrual period.  You develop pain in your abdomen. This information is not intended to replace advice given to you by your health care provider. Make sure you discuss any questions you have with your health care provider. Document Released: 05/28/2005 Document Revised: 01/30/2016 Document Reviewed: 02/19/2015 Elsevier Interactive Patient Education  2018 [employer].  Bacterial Vaginosis Bacterial vaginosis is a vaginal infection that occurs when the normal balance of bacteria in the vagina is disrupted. It  results from an overgrowth of certain bacteria. This is the most common vaginal infection among women ages 51-44. Because bacterial vaginosis increases your risk for STIs (sexually transmitted infections), getting treated can help reduce your risk for chlamydia, gonorrhea, herpes, and HIV (human immunodeficiency virus). Treatment is also important for preventing complications in pregnant women, because this condition can cause an early (premature) delivery. What are the causes? This condition is caused by an increase  in harmful bacteria that are normally present in small amounts in the vagina. However, the reason that the condition develops is not fully understood. What increases the risk? The following factors may make you more likely to develop this condition:  Having a new sexual partner or multiple sexual partners.  Having unprotected sex.  Douching.  Having an intrauterine device (IUD).  Smoking.  Drug and alcohol abuse.  Taking certain antibiotic medicines.  Being pregnant.  You cannot get bacterial vaginosis from toilet seats, bedding, swimming pools, or contact with objects around you. What are the signs or symptoms? Symptoms of this condition include:  Grey or white vaginal discharge. The discharge can also be watery or foamy.  A fish-like odor with discharge, especially after sexual intercourse or during menstruation.  Itching in and around the vagina.  Burning or pain with urination.  Some women with bacterial vaginosis have no signs or symptoms. How is this diagnosed? This condition is diagnosed based on:  Your medical history.  A physical exam of the vagina.  Testing a sample of vaginal fluid under a microscope to look for a large amount of bad bacteria or abnormal cells. Your health care provider may use a cotton swab or a small wooden spatula to collect the sample.  How is this treated? This condition is treated with antibiotics. These may be given as a  pill, a vaginal cream, or a medicine that is put into the vagina (suppository). If the condition comes back after treatment, a second round of antibiotics may be needed. Follow these instructions at home: Medicines  Take over-the-counter and prescription medicines only as told by your health care provider.  Take or use your antibiotic as told by your health care provider. Do not stop taking or using the antibiotic even if you start to feel better. General instructions  If you have a female sexual partner, tell her that you have a vaginal infection. She should see her health care provider and be treated if she has symptoms. If you have a female sexual partner, he does not need treatment.  During treatment: ? Avoid sexual activity until you finish treatment. ? Do not douche. ? Avoid alcohol as directed by your health care provider. ? Avoid breastfeeding as directed by your health care provider.  Drink enough water and fluids to keep your urine clear or pale yellow.  Keep the area around your vagina and rectum clean. ? Wash the area daily with warm water. ? Wipe yourself from front to back after using the toilet.  Keep all follow-up visits as told by your health care provider. This is important. How is this prevented?  Do not douche.  Wash the outside of your vagina with warm water only.  Use protection when having sex. This includes latex condoms and dental dams.  Limit how many sexual partners you have. To help prevent bacterial vaginosis, it is best to have sex with just one partner (monogamous).  Make sure you and your sexual partner are tested for STIs.  Wear cotton or cotton-lined underwear.  Avoid wearing tight pants and pantyhose, especially during summer.  Limit the amount of alcohol that you drink.  Do not use any products that contain nicotine or tobacco, such as cigarettes and e-cigarettes. If you need help quitting, ask your health care provider.  Do not use  illegal drugs. Where to find more information:  Centers for Disease Control and Prevention: AppraiserFraud.fi  American Sexual Health Association (ASHA): www.ashastd.org  [place] Department of  Health and Financial controller, Office on Women's Health: DustingSprays.pl or SecuritiesCard.it Contact a health care provider if:  Your symptoms do not improve, even after treatment.  You have more discharge or pain when urinating.  You have a fever.  You have pain in your abdomen.  You have pain during sex.  You have vaginal bleeding between periods. Summary  Bacterial vaginosis is a vaginal infection that occurs when the normal balance of bacteria in the vagina is disrupted.  Because bacterial vaginosis increases your risk for STIs (sexually transmitted infections), getting treated can help reduce your risk for chlamydia, gonorrhea, herpes, and HIV (human immunodeficiency virus). Treatment is also important for preventing complications in pregnant women, because the condition can cause an early (premature) delivery.  This condition is treated with antibiotic medicines. These may be given as a pill, a vaginal cream, or a medicine that is put into the vagina (suppository). This information is not intended to replace advice given to you by your health care provider. Make sure you discuss any questions you have with your health care provider. Document Released: 08/18/2005 Document Revised: 12/22/2016 Document Reviewed: 05/03/2016 Elsevier Interactive Patient Education  [employer]. and makes you sweat.  We recommend exercising at least 30 minutes per day at least 3 days a week, preferably 4 or 5.  We also recommend a diet low in fat and sugar.  Inactivity, poor dietary choices and obesity can cause diabetes, heart attack, stroke, and kidney damage, among others.    ALCOHOL AND SMOKING:  Women should limit their alcohol intake to no more than 7  drinks/beers/glasses of wine (combined, not each!) per week. Moderation of alcohol intake to this level decreases your risk of breast cancer and liver damage. And of course, no recreational drugs are part of a healthy lifestyle.  And absolutely no smoking or even second hand smoke. Most people know smoking can cause heart and lung diseases, but did you know it also contributes to weakening of your bones? Aging of your skin?  Yellowing of your teeth and nails?  CALCIUM AND VITAMIN D:  Adequate intake of calcium and Vitamin D are recommended.  The recommendations for exact amounts of these supplements seem to change often, but generally speaking 600 mg of calcium (either carbonate or citrate) and 800 units of Vitamin D per day seems prudent. Certain women may benefit from higher intake of Vitamin D.  If you are among these women, your doctor will have told you during your visit.    PAP SMEARS:  Pap smears, to check for cervical cancer or precancers,  have traditionally been done yearly, although recent scientific advances have shown that most women can have pap smears less often.  However, every woman still should have a physical exam from her gynecologist every year. It will include a breast check, inspection of the vulva and vagina to check for abnormal growths or skin changes, a visual exam of the cervix, and then an exam to evaluate the size and shape of the uterus and ovaries.  And after 25 years of age, a rectal exam is indicated to check for rectal cancers. We will also provide age appropriate advice regarding health maintenance, like when you should have certain vaccines, screening for sexually transmitted diseases, bone density testing, colonoscopy, mammograms, etc.   MAMMOGRAMS:  All women over 32 years old should have a yearly mammogram. Many facilities now offer a "3D" mammogram, which may cost around $50 extra out of pocket. If possible,  we  recommend you accept the option to have the 3D mammogram  performed.  It both reduces the number of women who will be called back for extra views which then turn out to be normal, and it is better than the routine mammogram at detecting truly abnormal areas.    COLONOSCOPY:  Colonoscopy to screen for colon cancer is recommended for all women at age 24.  We know, you hate the idea of the prep.  We agree, BUT, having colon cancer and not knowing it is worse!!  Colon cancer so often starts as a polyp that can be seen and removed at colonscopy, which can quite literally save your life!  And if your first colonoscopy is normal and you have no family history of colon cancer, most women don't have to have it again for 10 years.  Once every ten years, you can do something that may end up saving your life, right?  We will be happy to help you get it scheduled when you are ready.  Be sure to check your insurance coverage so you understand how much it will cost.  It may be covered as a preventative service at no cost, but you should check your particular policy.

## 2018-04-15 NOTE — Progress Notes (Signed)
27 y.o. G69P0010 Married Female here for evaluation of abnormal d/c and an annual exam. She c/o an increase vaginal d/c, slight itching, light yellow d/c, + odor. Symptoms started ~3 weeks ago. She also had pain with urination, but that stopped.    Period Cycle (Days): 28 Period Duration (Days): 4 Period Pattern: Regular Menstrual Flow: Heavy Menstrual Control: Maxi pad Menstrual Control Change Freq (Hours): 5-6 times daily Dysmenorrhea: (!) Severe Dysmenorrhea Symptoms: Cramping, Other (Comment), Headache(back pain)  She takes advil for her cramps, helps some.  Not using birth control for 3 months, not actively trying to conceive.  No dyspareunia   Patient's last menstrual period was 03/25/2018.          Sexually active: Yes.    The current method of family planning is none.    Exercising: Yes.    = Smoker:  Smokes a small amount of tobacco.   Health Maintenance: Pap:  never History of abnormal Pap:  no TDaP:  2018 Gardasil: done x 3   reports that she has been smoking pipe. She has never used smokeless tobacco. She reports that she does not drink alcohol or use drugs. Unemployed.   Past Medical History:  Diagnosis Date  . Allergy   . Anxiety     Past Surgical History:  Procedure Laterality Date  . tonsilect    . TONSILLECTOMY      Current Outpatient Medications  Medication Sig Dispense Refill  . benzonatate (TESSALON) 100 MG capsule Take 1 capsule (100 mg total) by mouth every 8 (eight) hours. 21 capsule 0  . naproxen (NAPROSYN) 375 MG tablet Take 1 tablet (375 mg total) by mouth 2 (two) times daily. 20 tablet 0   No current facility-administered medications for this visit.     No family history on file. Dad died of ALS 2 years ago.   Review of Systems  Constitutional: Negative.   HENT: Negative.   Eyes: Negative.   Respiratory: Negative.   Cardiovascular: Negative.   Gastrointestinal: Negative.   Endocrine: Negative.   Genitourinary: Positive for dysuria.        Vaginal irritation and odor  Musculoskeletal: Negative.   Skin: Negative.   Allergic/Immunologic: Negative.   Neurological: Negative.   Hematological: Negative.   Psychiatric/Behavioral: Negative.   All other systems reviewed and are negative.   Exam:   BP 100/68   Pulse 66   Ht 5\' 4"  (1.626 m)   Wt 118 lb (53.5 kg)   LMP 03/25/2018   BMI 20.25 kg/m   Weight change: @WEIGHTCHANGE @ Height:   Height: 5\' 4"  (162.6 cm)  Ht Readings from Last 3 Encounters:  04/15/18 5\' 4"  (1.626 m)  01/10/17 5\' 3"  (1.6 m)  07/29/16 5\' 3"  (1.6 m)    General appearance: alert, cooperative and appears stated age Head: Normocephalic, without obvious abnormality, atraumatic Neck: no adenopathy, supple, symmetrical, trachea midline and thyroid normal to inspection and palpation Lungs: clear to auscultation bilaterally Cardiovascular: regular rate and rhythm Breasts: normal appearance, no masses or tenderness Abdomen: soft, non-tender; non distended,  no masses,  no organomegaly Extremities: extremities normal, atraumatic, no cyanosis or edema Skin: Skin color, texture, turgor normal. No rashes or lesions Lymph nodes: Cervical, supraclavicular, and axillary nodes normal. No abnormal inguinal nodes palpated Neurologic: Grossly normal   Pelvic: External genitalia:  no lesions, + erythema              Urethra:  normal appearing urethra with no masses, tenderness or lesions  Bartholins and Skenes: normal                 Vagina: erythematous appearing vagina with an increase in watery, white vaginal d/c              Cervix: no lesions               Bimanual Exam:  Uterus:  normal size, contour, position, consistency, mobility, non-tender              Adnexa: no mass, fullness, tenderness               Rectovaginal: Confirms               Anus:  normal sphincter tone, no lesions  Chaperone was present for exam.  Wet prep: ++ clue, no trich, few wbc KOH: few yeast PH: 5.5   A:   Well Woman with normal exam  Vulvovaginits, BV and yeast  Dysmenorrhea  Planning pregnancy    P:   Pap with reflex hpv, GC/CT  Treat BV and yeast  Discussed breast self exam  Discussed calcium and vit D intake  Declines blood work (will call when ready for screening labs)  Discussed good health before pregnancy, information given  Start PNV

## 2018-04-16 LAB — CYTOLOGY - PAP
Chlamydia: NEGATIVE
Diagnosis: NEGATIVE
Neisseria Gonorrhea: NEGATIVE

## 2018-06-03 ENCOUNTER — Encounter: Payer: Self-pay | Admitting: Certified Nurse Midwife

## 2018-06-03 ENCOUNTER — Ambulatory Visit (INDEPENDENT_AMBULATORY_CARE_PROVIDER_SITE_OTHER): Payer: Medicaid Other | Admitting: Certified Nurse Midwife

## 2018-06-03 ENCOUNTER — Other Ambulatory Visit (HOSPITAL_COMMUNITY)
Admission: RE | Admit: 2018-06-03 | Discharge: 2018-06-03 | Disposition: A | Discharge status: Home / Self Care | Payer: Medicaid Other | Source: Ambulatory Visit | Attending: Certified Nurse Midwife | Admitting: Certified Nurse Midwife

## 2018-06-03 VITALS — BP 110/74 | HR 86 | Wt 117.8 lb

## 2018-06-03 DIAGNOSIS — Z3481 Encounter for supervision of other normal pregnancy, first trimester: Secondary | ICD-10-CM

## 2018-06-03 DIAGNOSIS — D509 Iron deficiency anemia, unspecified: Secondary | ICD-10-CM

## 2018-06-03 DIAGNOSIS — O99011 Anemia complicating pregnancy, first trimester: Secondary | ICD-10-CM

## 2018-06-03 DIAGNOSIS — Z87891 Personal history of nicotine dependence: Secondary | ICD-10-CM | POA: Diagnosis not present

## 2018-06-03 DIAGNOSIS — O99019 Anemia complicating pregnancy, unspecified trimester: Principal | ICD-10-CM

## 2018-06-03 DIAGNOSIS — B373 Candidiasis of vulva and vagina: Secondary | ICD-10-CM

## 2018-06-03 DIAGNOSIS — Z3A10 10 weeks gestation of pregnancy: Secondary | ICD-10-CM | POA: Insufficient documentation

## 2018-06-03 DIAGNOSIS — B3731 Acute candidiasis of vulva and vagina: Secondary | ICD-10-CM

## 2018-06-03 DIAGNOSIS — Z3480 Encounter for supervision of other normal pregnancy, unspecified trimester: Secondary | ICD-10-CM

## 2018-06-03 NOTE — Progress Notes (Signed)
Subjective:   Cosette Prindle is a 27 y.o. G2P0010 at [redacted]w[redacted]d by LMP being seen today for her first obstetrical visit.  Her obstetrical history is significant for nothing. Patient does intend to breast feed. Pregnancy history fully reviewed.  Patient reports no complaints.  HISTORY: OB History  Gravida Para Term Preterm AB Living  2 0 0 0 1 0  SAB TAB Ectopic Multiple Live Births  0 0 0 0 0    # Outcome Date GA Lbr Len/2nd Weight Sex Delivery Anes PTL Lv  2 Current           1 AB 2019 [redacted]w[redacted]d           Last pap smear was done 04/15/2018 and was normal  Past Medical History:  Diagnosis Date  . Allergy   . Anxiety    Past Surgical History:  Procedure Laterality Date  . tonsilect    . TONSILLECTOMY     Family History  Problem Relation Age of Onset  . ALS Father    Social History   Tobacco Use  . Smoking status: Former Smoker    Types: Pipe  . Smokeless tobacco: Never Used  Substance Use Topics  . Alcohol use: No  . Drug use: No   No Known Allergies Current Outpatient Medications on File Prior to Visit  Medication Sig Dispense Refill  . Prenatal Vit-Fe Fumarate-FA (PRENATAL MULTIVITAMIN) TABS tablet Take 1 tablet by mouth daily at 12 noon.    . benzonatate (TESSALON) 100 MG capsule Take 1 capsule (100 mg total) by mouth every 8 (eight) hours. (Patient not taking: Reported on 06/03/2018) 21 capsule 0  . betamethasone valerate ointment (VALISONE) 0.1 % Apply a pea sized amount topically BID x 1-2 weeks as needed. (Patient not taking: Reported on 06/03/2018) 15 g 0  . ibuprofen (ADVIL,MOTRIN) 800 MG tablet Take 1 tablet (800 mg total) by mouth every 8 (eight) hours as needed. (Patient not taking: Reported on 06/03/2018) 30 tablet 1  . metroNIDAZOLE (FLAGYL) 500 MG tablet Take 1 tablet (500 mg total) by mouth 2 (two) times daily. (Patient not taking: Reported on 06/03/2018) 14 tablet 0  . naproxen (NAPROSYN) 375 MG tablet Take 1 tablet (375 mg total) by mouth 2 (two) times daily.  (Patient not taking: Reported on 06/03/2018) 20 tablet 0   No current facility-administered medications on file prior to visit.     Review of Systems Pertinent items noted in HPI and remainder of comprehensive ROS otherwise negative.  Exam   Vitals:   06/03/18 1022  BP: 110/74  Pulse: 86  Weight: 117 lb 12.8 oz (53.4 kg)   Fetal Heart Rate (bpm): 165  Pelvic Exam: Perineum: no hemorrhoids, normal perineum   Vulva: normal external genitalia, no lesions   Vagina:  No discharge present at vaginal introitus   Cervix: Closed/thick. Pap not done- recent pap 04/2018   Adnexa: normal adnexa and no mass, fullness, tenderness   Bony Pelvis: average  System: General: well-developed, well-nourished female in no acute distress   Breast:  normal appearance, no masses or tenderness   Skin: normal coloration and turgor, no rashes   Neurologic: oriented, normal, negative, normal mood   Extremities: normal strength, tone, and muscle mass, ROM of all joints is normal   HEENT PERRLA, extraocular movement intact and sclera clear   Mouth/Teeth mucous membranes moist, pharynx normal without lesions and dental hygiene good   Neck supple and no masses   Cardiovascular: regular rate and  rhythm   Respiratory:  no respiratory distress, normal breath sounds   Abdomen: soft, non-tender; bowel sounds normal; no masses,  no organomegaly     Assessment:   Pregnancy: G2P0010 Patient Active Problem List   Diagnosis Date Noted  . Supervision of other normal pregnancy, antepartum 06/03/2018     Plan:  1. Supervision of other normal pregnancy, antepartum - Patient doing well, no complaints  - Educated and discussed safe medications during pregnancy- list given - Educated on schedule of prenatal visits, assess to MyChart and Babyscipts  - Cervicovaginal ancillary only - Obstetric Panel, Including HIV - Hemoglobinopathy evaluation - Cystic Fibrosis Mutation 5 - SMN1 Copy Number Analysis - Genetic  Screening - Culture, OB Urine - Enroll Patient in Babyscripts   Initial labs drawn. Continue prenatal vitamins. Genetic Screening discussed, NIPS: ordered. Ultrasound discussed; fetal anatomic survey: requested. Problem list reviewed and updated. The nature of [facility] with multiple MDs and other Advanced Practice Providers was explained to patient; also emphasized that residents, students are part of our team. Routine obstetric precautions reviewed. Return in about 4 weeks (around 07/01/2018) for ROB.     Lajean Manes, [place] for [employer], [place]

## 2018-06-03 NOTE — Progress Notes (Signed)
Patient is in the office for initial ob visit with husband, denies pain today.

## 2018-06-03 NOTE — Patient Instructions (Addendum)
First Trimester of Pregnancy The first trimester of pregnancy is from week 1 until the end of week 13 (months 1 through 3). During this time, your baby will begin to develop inside you. At 6-8 weeks, the eyes and face are formed, and the heartbeat can be seen on ultrasound. At the end of 12 weeks, all the baby's organs are formed. Prenatal care is all the medical care you receive before the birth of your baby. Make sure you get good prenatal care and follow all of your doctor's instructions. Follow these instructions at home: Medicines  Take over-the-counter and prescription medicines only as told by your doctor. Some medicines are safe and some medicines are not safe during pregnancy.  Take a prenatal vitamin that contains at least 600 micrograms (mcg) of folic acid.  If you have trouble pooping (constipation), take medicine that will make your stool soft (stool softener) if your doctor approves. Eating and drinking  Eat regular, healthy meals.  Your doctor will tell you the amount of weight gain that is right for you.  Avoid raw meat and uncooked cheese.  If you feel sick to your stomach (nauseous) or throw up (vomit): ? Eat 4 or 5 small meals a day instead of 3 large meals. ? Try eating a few soda crackers. ? Drink liquids between meals instead of during meals.  To prevent constipation: ? Eat foods that are high in fiber, like fresh fruits and vegetables, whole grains, and beans. ? Drink enough fluids to keep your pee (urine) clear or pale yellow. Activity  Exercise only as told by your doctor. Stop exercising if you have cramps or pain in your lower belly (abdomen) or low back.  Do not exercise if it is too hot, too humid, or if you are in a place of great height (high altitude).  Try to avoid standing for long periods of time. Move your legs often if you must stand in one place for a long time.  Avoid heavy lifting.  Wear low-heeled shoes. Sit and stand up straight.  You  can have sex unless your doctor tells you not to. Relieving pain and discomfort  Wear a good support bra if your breasts are sore.  Take warm water baths (sitz baths) to soothe pain or discomfort caused by hemorrhoids. Use hemorrhoid cream if your doctor says it is okay.  Rest with your legs raised if you have leg cramps or low back pain.  If you have puffy, bulging veins (varicose veins) in your legs: ? Wear support hose or compression stockings as told by your doctor. ? Raise (elevate) your feet for 15 minutes, 3-4 times a day. ? Limit salt in your food. Prenatal care  Schedule your prenatal visits by the twelfth week of pregnancy.  Write down your questions. Take them to your prenatal visits.  Keep all your prenatal visits as told by your doctor. This is important. Safety  Wear your seat belt at all times when driving.  Make a list of emergency phone numbers. The list should include numbers for family, friends, the hospital, and police and fire departments. General instructions  Ask your doctor for a referral to a local prenatal class. Begin classes no later than at the start of month 6 of your pregnancy.  Ask for help if you need counseling or if you need help with nutrition. Your doctor can give you advice or tell you where to go for help.  Do not use hot tubs, steam rooms, or   saunas.  Do not douche or use tampons or scented sanitary pads.  Do not cross your legs for long periods of time.  Avoid all herbs and alcohol. Avoid drugs that are not approved by your doctor.  Do not use any tobacco products, including cigarettes, chewing tobacco, and electronic cigarettes. If you need help quitting, ask your doctor. You may get counseling or other support to help you quit.  Avoid cat litter boxes and soil used by cats. These carry germs that can cause birth defects in the baby and can cause a loss of your baby (miscarriage) or stillbirth.  Visit your dentist. At home, brush  your teeth with a soft toothbrush. Be gentle when you floss. Contact a doctor if:  You are dizzy.  You have mild cramps or pressure in your lower belly.  You have a nagging pain in your belly area.  You continue to feel sick to your stomach, you throw up, or you have watery poop (diarrhea).  You have a bad smelling fluid coming from your vagina.  You have pain when you pee (urinate).  You have increased puffiness (swelling) in your face, hands, legs, or ankles. Get help right away if:  You have a fever.  You are leaking fluid from your vagina.  You have spotting or bleeding from your vagina.  You have very bad belly cramping or pain.  You gain or lose weight rapidly.  You throw up blood. It may look like coffee grounds.  You are around people who have [place] measles, fifth disease, or chickenpox.  You have a very bad headache.  You have shortness of breath.  You have any kind of trauma, such as from a fall or a car accident. Summary  The first trimester of pregnancy is from week 1 until the end of week 13 (months 1 through 3).  To take care of yourself and your unborn baby, you will need to eat healthy meals, take medicines only if your doctor tells you to do so, and do activities that are safe for you and your baby.  Keep all follow-up visits as told by your doctor. This is important as your doctor will have to ensure that your baby is healthy and growing well. This information is not intended to replace advice given to you by your health care provider. Make sure you discuss any questions you have with your health care provider. Document Released: 02/04/2008 Document Revised: 08/26/2016 Document Reviewed: 08/26/2016 Elsevier Interactive Patient Education  2017 [place] Medications in Pregnancy   Acne: Benzoyl Peroxide Salicylic Acid  Backache/Headache: Tylenol: 2 regular strength every 4 hours OR              2 Extra strength every 6  hours  Colds/Coughs/Allergies: Benadryl (alcohol free) 25 mg every 6 hours as needed Breath right strips Claritin Cepacol throat lozenges Chloraseptic throat spray Cold-Eeze- up to three times per day Cough drops, alcohol free Flonase (by prescription only) Guaifenesin Mucinex Robitussin DM (plain only, alcohol free) Saline nasal spray/drops Sudafed (pseudoephedrine) & Actifed ** use only after [redacted] weeks gestation and if you do not have high blood pressure Tylenol Vicks Vaporub Zinc lozenges Zyrtec   Constipation: Colace Ducolax suppositories Fleet enema Glycerin suppositories Metamucil Milk of magnesia Miralax Senokot Smooth move tea  Diarrhea: Kaopectate Imodium A-D  *NO pepto Bismol  Hemorrhoids: Anusol Anusol HC Preparation H Tucks  Indigestion: Tums Maalox Mylanta Zantac  Pepcid  Insomnia: Benadryl (alcohol free) 25mg   every 6 hours as needed Tylenol PM Unisom, no Gelcaps  Leg Cramps: Tums MagGel  Nausea/Vomiting:  Bonine Dramamine Emetrol Ginger extract Sea bands Meclizine  Nausea medication to take during pregnancy:  Unisom (doxylamine succinate 25 mg tablets) Take one tablet daily at bedtime. If symptoms are not adequately controlled, the dose can be increased to a maximum recommended dose of two tablets daily (1/2 tablet in the morning, 1/2 tablet mid-afternoon and one at bedtime). Vitamin B6 100mg  tablets. Take one tablet twice a day (up to 200 mg per day).  Skin Rashes: Aveeno products Benadryl cream or 25mg  every 6 hours as needed Calamine Lotion 1% cortisone cream  Yeast infection: Gyne-lotrimin 7 Monistat 7   **If taking multiple medications, please check labels to avoid duplicating the same active ingredients **take medication as directed on the label ** Do not exceed 4000 mg of tylenol in 24 hours **Do not take medications that contain aspirin or ibuprofen

## 2018-06-04 LAB — CERVICOVAGINAL ANCILLARY ONLY
Chlamydia: NEGATIVE
Neisseria Gonorrhea: NEGATIVE
Trichomonas: NEGATIVE

## 2018-06-07 LAB — CULTURE, OB URINE

## 2018-06-07 LAB — OBSTETRIC PANEL, INCLUDING HIV
Antibody Screen: NEGATIVE
Basophils Absolute: 0 10*3/uL (ref 0.0–0.2)
Basos: 1 %
EOS (ABSOLUTE): 0.2 10*3/uL (ref 0.0–0.4)
Eos: 3 %
HIV Screen 4th Generation wRfx: NONREACTIVE
Hematocrit: 33.4 % — ABNORMAL LOW (ref 34.0–46.6)
Hemoglobin: 9.5 g/dL — ABNORMAL LOW (ref 11.1–15.9)
Hepatitis B Surface Ag: NEGATIVE
Immature Grans (Abs): 0 10*3/uL (ref 0.0–0.1)
Immature Granulocytes: 0 %
Lymphocytes Absolute: 0.9 10*3/uL (ref 0.7–3.1)
Lymphs: 15 %
MCH: 18.7 pg — ABNORMAL LOW (ref 26.6–33.0)
MCHC: 28.4 g/dL — ABNORMAL LOW (ref 31.5–35.7)
MCV: 66 fL — ABNORMAL LOW (ref 79–97)
Monocytes Absolute: 0.4 10*3/uL (ref 0.1–0.9)
Monocytes: 6 %
Neutrophils Absolute: 4.6 10*3/uL (ref 1.4–7.0)
Neutrophils: 75 %
Platelets: 321 10*3/uL (ref 150–450)
RBC: 5.08 x10E6/uL (ref 3.77–5.28)
RDW: 18.4 % — ABNORMAL HIGH (ref 12.3–15.4)
RPR Ser Ql: NONREACTIVE
Rh Factor: POSITIVE
Rubella Antibodies, IGG: 3.91 index (ref >0.99)
WBC: 6.1 10*3/uL (ref 3.4–10.8)

## 2018-06-07 LAB — HEMOGLOBINOPATHY EVALUATION
HGB C: 0 %
HGB S: 0 %
HGB VARIANT: 0 %
Hemoglobin A2 Quantitation: 1.8 % (ref 1.8–3.2)
Hemoglobin F Quantitation: 0 % (ref 0.0–2.0)
Hgb A: 98.2 % (ref 96.4–98.8)

## 2018-06-07 LAB — URINE CULTURE, OB REFLEX

## 2018-06-10 DIAGNOSIS — D509 Iron deficiency anemia, unspecified: Secondary | ICD-10-CM | POA: Insufficient documentation

## 2018-06-10 DIAGNOSIS — O99019 Anemia complicating pregnancy, unspecified trimester: Principal | ICD-10-CM

## 2018-06-10 LAB — CYSTIC FIBROSIS MUTATION 97: Interpretation: NOT DETECTED

## 2018-06-10 MED ORDER — TERCONAZOLE 0.8 % VA CREA: 1.0000 | TOPICAL_CREAM | Freq: Every day | VAGINAL | 0 refills | Status: DC

## 2018-06-10 MED ORDER — FERROUS SULFATE 325 (65 FE) MG PO TABS: 325.0000 mg | ORAL_TABLET | Freq: Every day | ORAL | 1 refills | Status: DC

## 2018-06-10 NOTE — Addendum Note (Signed)
Addended by: Lajean Manes on: 06/10/2018 04:54 PM   Modules accepted: Orders

## 2018-06-14 LAB — SMN1 COPY NUMBER ANALYSIS (SMA CARRIER SCREENING)

## 2018-07-02 ENCOUNTER — Encounter: Payer: Self-pay | Admitting: Obstetrics and Gynecology

## 2018-07-02 ENCOUNTER — Other Ambulatory Visit: Payer: Self-pay

## 2018-07-02 ENCOUNTER — Ambulatory Visit (INDEPENDENT_AMBULATORY_CARE_PROVIDER_SITE_OTHER): Payer: Medicaid Other | Admitting: Obstetrics and Gynecology

## 2018-07-02 VITALS — BP 100/64 | HR 93 | Wt 122.0 lb

## 2018-07-02 DIAGNOSIS — Z3480 Encounter for supervision of other normal pregnancy, unspecified trimester: Secondary | ICD-10-CM

## 2018-07-02 MED ORDER — CLOTRIMAZOLE 1 % VA CREA: 1.0000 | TOPICAL_CREAM | Freq: Every day | VAGINAL | 0 refills | Status: DC

## 2018-07-02 NOTE — Progress Notes (Signed)
ROB. FLU given in LD, tolerated well. C/o dry cough that tightens burns her chest.

## 2018-07-02 NOTE — Addendum Note (Signed)
Addended by: Vivien Rota on: 07/02/2018 10:54 AM   Modules accepted: Orders

## 2018-07-02 NOTE — Progress Notes (Addendum)
   PRENATAL VISIT NOTE  Subjective:  Emily Wilkins is a 27 y.o. G2P0010 at [redacted]w[redacted]d being seen today for ongoing prenatal care.  She is currently monitored for the following issues for this low-risk pregnancy and has Supervision of other normal pregnancy, antepartum and Iron deficiency anemia during pregnancy, antepartum on their problem list.  Patient reports cramping that has been ongoing since the beginning of her pregnancy. Also with a cough that started a couple of days ago..  Contractions: Irritability. Vag. Bleeding: None.   . Denies leaking of fluid.   The following portions of the patient's history were reviewed and updated as appropriate: allergies, current medications, past family history, past medical history, past social history, past surgical history and problem list. Problem list updated.  Objective:   Vitals:   07/02/18 1025  BP: 100/64  Pulse: 93  Weight: 122 lb (55.3 kg)    Fetal Status: Fetal Heart Rate (bpm): 159         General:  Alert, oriented and cooperative. Patient is in no acute distress.  Skin: Skin is warm and dry. No rash noted.   Cardiovascular: Normal heart rate noted  Respiratory: Normal respiratory effort, no problems with respiration noted  Abdomen: Soft, gravid, appropriate for gestational age.  Pain/Pressure: Absent     Pelvic: Cervical exam deferred        Extremities: Normal range of motion.  Edema: None  Mental Status: Normal mood and affect. Normal behavior. Normal judgment and thought content.   Assessment and Plan:  Pregnancy: G2P0010 at [redacted]w[redacted]d  1. Supervision of other normal pregnancy, antepartum Gave info for contraception - AFP, Serum, Open Spina Bifida - [place] MFM OB COMP + 14 WK; Future   Preterm labor symptoms and general obstetric precautions including but not limited to vaginal bleeding, contractions, leaking of fluid and fetal movement were reviewed in detail with the patient. Please refer to After Visit Summary for other counseling  recommendations.  Return in about 4 weeks (around 07/30/2018) for OB visit.  No future appointments.  Sloan Leiter, MD

## 2018-07-02 NOTE — Patient Instructions (Addendum)
Contraception Choices Contraception, also called birth control, refers to methods or devices that prevent pregnancy. Hormonal methods Contraceptive implant A contraceptive implant is a thin, plastic tube that contains a hormone. It is inserted into the upper part of the arm. It can remain in place for up to 3 years. Progestin-only injections Progestin-only injections are injections of progestin, a synthetic form of the hormone progesterone. They are given every 3 months by a health care provider. Birth control pills Birth control pills are pills that contain hormones that prevent pregnancy. They must be taken once a day, preferably at the same time each day. Birth control patch The birth control patch contains hormones that prevent pregnancy. It is placed on the skin and must be changed once a week for three weeks and removed on the fourth week. A prescription is needed to use this method of contraception. Vaginal ring A vaginal ring contains hormones that prevent pregnancy. It is placed in the vagina for three weeks and removed on the fourth week. After that, the process is repeated with a new ring. A prescription is needed to use this method of contraception. Emergency contraceptive Emergency contraceptives prevent pregnancy after unprotected sex. They come in pill form and can be taken up to 5 days after sex. They work best the sooner they are taken after having sex. Most emergency contraceptives are available without a prescription. This method should not be used as your only form of birth control. Barrier methods Female condom A female condom is a thin sheath that is worn over the penis during sex. Condoms keep sperm from going inside a woman's body. They can be used with a spermicide to increase their effectiveness. They should be disposed after a single use. Female condom A female condom is a soft, loose-fitting sheath that is put into the vagina before sex. The condom keeps sperm from going  inside a woman's body. They should be disposed after a single use.  Intrauterine contraception Intrauterine device (IUD) An IUD is a T-shaped device that is put in a woman's uterus. There are two types:  Hormone IUD.This type contains progestin, a synthetic form of the hormone progesterone. This type can stay in place for 3-5 years.  Copper IUD.This type is wrapped in copper wire. It can stay in place for 10 years.  Permanent methods of contraception Female tubal ligation In this method, a woman's fallopian tubes are sealed, tied, or blocked during surgery to prevent eggs from traveling to the uterus.  Female sterilization This is a procedure to tie off the tubes that carry sperm (vasectomy). After the procedure, the man can still ejaculate fluid (semen).  Summary  Contraception, also called birth control, means methods or devices that prevent pregnancy.  Hormonal methods of contraception include implants, injections, pills, patches, vaginal rings, and emergency contraceptives.  Barrier methods of contraception can include female condoms, female condoms, diaphragms, cervical caps, sponges, and spermicides.  There are two types of IUDs (intrauterine devices). An IUD can be put in a woman's uterus to prevent pregnancy for 3-5 years.  Permanent sterilization can be done through a procedure for males, females, or both. This information is not intended to replace advice given to you by your health care provider. Make sure you discuss any questions you have with your health care provider. Document Released: 08/18/2005 Document Revised: 09/20/2016 Document Reviewed: 09/20/2016 Elsevier Interactive Patient Education  2018 [employer]  Etonogestrel implant What is this medicine? ETONOGESTREL (et oh noe JES trel) is a contraceptive (birth   control) device. It is used to prevent pregnancy. It can be used for up to 3 years. This medicine may be used for other purposes; ask your health care  provider or pharmacist if you have questions. COMMON BRAND NAME(S): Implanon, Nexplanon What should I tell my health care provider before I take this medicine? They need to know if you have any of these conditions: -abnormal vaginal bleeding -blood vessel disease or blood clots -cancer of the breast, cervix, or liver -depression -diabetes -gallbladder disease -headaches -heart disease or recent heart attack -high blood pressure -high cholesterol -kidney disease -liver disease -renal disease -seizures -tobacco smoker -an unusual or allergic reaction to etonogestrel, other hormones, anesthetics or antiseptics, medicines, foods, dyes, or preservatives -pregnant or trying to get pregnant -breast-feeding How should I use this medicine? This device is inserted just under the skin on the inner side of your upper arm by a health care professional. Talk to your pediatrician regarding the use of this medicine in children. Special care may be needed. Overdosage: If you think you have taken too much of this medicine contact a poison control center or emergency room at once. NOTE: This medicine is only for you. Do not share this medicine with others. What if I miss a dose? This does not apply. What may interact with this medicine? Do not take this medicine with any of the following medications: -amprenavir -bosentan -fosamprenavir This medicine may also interact with the following medications: -barbiturate medicines for inducing sleep or treating seizures -certain medicines for fungal infections like ketoconazole and itraconazole -grapefruit juice -griseofulvin -medicines to treat seizures like carbamazepine, felbamate, oxcarbazepine, phenytoin, topiramate -modafinil -phenylbutazone -rifampin -rufinamide -some medicines to treat HIV infection like atazanavir, indinavir, lopinavir, nelfinavir, tipranavir, ritonavir -St. John's wort This list may not describe all possible interactions.  Give your health care provider a list of all the medicines, herbs, non-prescription drugs, or dietary supplements you use. Also tell them if you smoke, drink alcohol, or use illegal drugs. Some items may interact with your medicine. What should I watch for while using this medicine? This product does not protect you against HIV infection (AIDS) or other sexually transmitted diseases. You should be able to feel the implant by pressing your fingertips over the skin where it was inserted. Contact your doctor if you cannot feel the implant, and use a non-hormonal birth control method (such as condoms) until your doctor confirms that the implant is in place. If you feel that the implant may have broken or become bent while in your arm, contact your healthcare provider. What side effects may I notice from receiving this medicine? Side effects that you should report to your doctor or health care professional as soon as possible: -allergic reactions like skin rash, itching or hives, swelling of the face, lips, or tongue -breast lumps -changes in emotions or moods -depressed mood -heavy or prolonged menstrual bleeding -pain, irritation, swelling, or bruising at the insertion site -scar at site of insertion -signs of infection at the insertion site such as fever, and skin redness, pain or discharge -signs of pregnancy -signs and symptoms of a blood clot such as breathing problems; changes in vision; chest pain; severe, sudden headache; pain, swelling, warmth in the leg; trouble speaking; sudden numbness or weakness of the face, arm or leg -signs and symptoms of liver injury like dark yellow or brown urine; general ill feeling or flu-like symptoms; light-colored stools; loss of appetite; nausea; right upper belly pain; unusually weak or tired; yellowing of the   eyes or skin -unusual vaginal bleeding, discharge -signs and symptoms of a stroke like changes in vision; confusion; trouble speaking or understanding;  severe headaches; sudden numbness or weakness of the face, arm or leg; trouble walking; dizziness; loss of balance or coordination Side effects that usually do not require medical attention (report to your doctor or health care professional if they continue or are bothersome): -acne -back pain -breast pain -changes in weight -dizziness -general ill feeling or flu-like symptoms -headache -irregular menstrual bleeding -nausea -sore throat -vaginal irritation or inflammation This list may not describe all possible side effects. Call your doctor for medical advice about side effects. You may report side effects to FDA at 1-800-FDA-1088. Where should I keep my medicine? This drug is given in a hospital or clinic and will not be stored at home. NOTE: This sheet is a summary. It may not cover all possible information. If you have questions about this medicine, talk to your doctor, pharmacist, or health care provider.  2018 Elsevier/Gold Standard (2016-03-06 11:19:22)  Intrauterine Device Information An intrauterine device (IUD) is inserted into your uterus to prevent pregnancy. There are two types of IUDs available:  Copper IUD-This type of IUD is wrapped in copper wire and is placed inside the uterus. Copper makes the uterus and fallopian tubes produce a fluid that kills sperm. The copper IUD can stay in place for 10 years.  Hormone IUD-This type of IUD contains the hormone progestin (synthetic progesterone). The hormone thickens the cervical mucus and prevents sperm from entering the uterus. It also thins the uterine lining to prevent implantation of a fertilized egg. The hormone can weaken or kill the sperm that get into the uterus. One type of hormone IUD can stay in place for 5 years, and another type can stay in place for 3 years.  Your health care provider will make sure you are a good candidate for a contraceptive IUD. Discuss with your health care provider the possible side  effects. Advantages of an intrauterine device  IUDs are highly effective, reversible, long acting, and low maintenance.  There are no estrogen-related side effects.  An IUD can be used when breastfeeding.  IUDs are not associated with weight gain.  The copper IUD works immediately after insertion.  The hormone IUD works right away if inserted within 7 days of your period starting. You will need to use a backup method of birth control for 7 days if the hormone IUD is inserted at any other time in your cycle.  The copper IUD does not interfere with your female hormones.  The hormone IUD can make heavy menstrual periods lighter and decrease cramping.  The hormone IUD can be used for 3 or 5 years.  The copper IUD can be used for 10 years. Disadvantages of an intrauterine device  The hormone IUD can be associated with irregular bleeding patterns.  The copper IUD can make your menstrual flow heavier and more painful.  You may experience cramping and vaginal bleeding after insertion. This information is not intended to replace advice given to you by your health care provider. Make sure you discuss any questions you have with your health care provider. Document Released: 07/22/2004 Document Revised: 01/24/2016 Document Reviewed: 02/06/2013 Elsevier Interactive Patient Education  2017 [employer].     For colds and allergies  Any anti-histamine including benadryl, allegra, claritin, etc.  Sudafed but not phenylephrine  Mucinex  Robitussin  For Reflux/heartburn  Pepcid Tums Prilosec Prevacid  For yeast infections  Monistat  For constipation  Colace  For minor aches and pains  Tylenol-do not take more than 4000mg  in 24 hours. Therma-care or like heat packs

## 2018-07-07 LAB — AFP, SERUM, OPEN SPINA BIFIDA
AFP MoM: 0.68
AFP VALUE AFPOSL: 22.5 ng/mL
GEST. AGE ON COLLECTION DATE: 15 wk
Maternal Age At EDD: 25.8 yr
OSBR RISK 1 IN: 10000
Test Results:: NEGATIVE
WEIGHT: 122 [lb_av]

## 2018-07-28 ENCOUNTER — Encounter (HOSPITAL_COMMUNITY): Payer: Self-pay

## 2018-08-04 ENCOUNTER — Encounter: Payer: Self-pay | Admitting: Obstetrics and Gynecology

## 2018-08-04 ENCOUNTER — Ambulatory Visit (HOSPITAL_COMMUNITY)
Admission: RE | Admit: 2018-08-04 | Discharge: 2018-08-04 | Disposition: A | Discharge status: Home / Self Care | Payer: Medicaid Other | Source: Ambulatory Visit | Attending: Obstetrics and Gynecology | Admitting: Obstetrics and Gynecology

## 2018-08-04 ENCOUNTER — Other Ambulatory Visit: Payer: Self-pay

## 2018-08-04 ENCOUNTER — Ambulatory Visit (INDEPENDENT_AMBULATORY_CARE_PROVIDER_SITE_OTHER): Payer: Medicaid Other | Admitting: Obstetrics and Gynecology

## 2018-08-04 VITALS — BP 103/72 | HR 77 | Wt 128.5 lb

## 2018-08-04 DIAGNOSIS — Z3480 Encounter for supervision of other normal pregnancy, unspecified trimester: Secondary | ICD-10-CM

## 2018-08-04 DIAGNOSIS — Z363 Encounter for antenatal screening for malformations: Secondary | ICD-10-CM | POA: Diagnosis present

## 2018-08-04 DIAGNOSIS — Z3687 Encounter for antenatal screening for uncertain dates: Secondary | ICD-10-CM | POA: Diagnosis not present

## 2018-08-04 DIAGNOSIS — D509 Iron deficiency anemia, unspecified: Secondary | ICD-10-CM

## 2018-08-04 DIAGNOSIS — Z3A19 19 weeks gestation of pregnancy: Secondary | ICD-10-CM

## 2018-08-04 DIAGNOSIS — O99012 Anemia complicating pregnancy, second trimester: Secondary | ICD-10-CM

## 2018-08-04 DIAGNOSIS — O99019 Anemia complicating pregnancy, unspecified trimester: Secondary | ICD-10-CM

## 2018-08-04 NOTE — Progress Notes (Signed)
   PRENATAL VISIT NOTE  Subjective:  Emily Wilkins is a 27 y.o. G2P0010 at [redacted]w[redacted]d being seen today for ongoing prenatal care.  She is currently monitored for the following issues for this low-risk pregnancy and has Supervision of other normal pregnancy, antepartum and Iron deficiency anemia during pregnancy, antepartum on their problem list.  Patient reports no complaints.  Contractions: Not present. Vag. Bleeding: None.   . Denies leaking of fluid.   The following portions of the patient's history were reviewed and updated as appropriate: allergies, current medications, past family history, past medical history, past social history, past surgical history and problem list. Problem list updated.  Objective:   Vitals:   08/04/18 1025  BP: 103/72  Pulse: 77  Weight: 128 lb 8 oz (58.3 kg)    Fetal Status: Fetal Heart Rate (bpm): 144         General:  Alert, oriented and cooperative. Patient is in no acute distress.  Skin: Skin is warm and dry. No rash noted.   Cardiovascular: Normal heart rate noted  Respiratory: Normal respiratory effort, no problems with respiration noted  Abdomen: Soft, gravid, appropriate for gestational age.  Pain/Pressure: Present     Pelvic: Cervical exam deferred        Extremities: Normal range of motion.  Edema: Trace  Mental Status: Normal mood and affect. Normal behavior. Normal judgment and thought content.   Assessment and Plan:  Pregnancy: G2P0010 at [redacted]w[redacted]d  1. Supervision of other normal pregnancy, antepartum Patient is doing well Patient completed anatomy ultrasound today- report not yet available Patient states that follow up appointment was scheduled to complete anatomy  2. Iron deficiency anemia during pregnancy, antepartum Continue iron supplements  Preterm labor symptoms and general obstetric precautions including but not limited to vaginal bleeding, contractions, leaking of fluid and fetal movement were reviewed in detail with the  patient. Please refer to After Visit Summary for other counseling recommendations.  Return in about 4 weeks (around 09/01/2018) for ROB, Low risk.  Future Appointments  Date Time Provider [place]  09/02/2018 10:45 AM [place] 2 [facility]    Mora Bellman, MD

## 2018-08-05 ENCOUNTER — Other Ambulatory Visit (HOSPITAL_COMMUNITY): Payer: Self-pay | Admitting: *Deleted

## 2018-08-05 DIAGNOSIS — Z362 Encounter for other antenatal screening follow-up: Secondary | ICD-10-CM

## 2018-09-01 NOTE — L&D Delivery Note (Signed)
Patient: Emily Wilkins MRN: [id number]  GBS status: negative, IAP: not indicated  Patient is a 28 y.o. now G2P1 s/p NSVD at [redacted]w[redacted]d, who was admitted for early labor and DFM. SROM 4h 24m prior to delivery with light meconium stained fluid.    Delivery Note At 5:16 AM a viable female was delivered via Vaginal, Spontaneous (Presentation: cephalic;  ROA).  APGAR: 9, 9; weight 7 lb 3.7 oz (3280 g).   Placenta status: intact, 3-vessel cord.  Cord:  with the following complications: none.  Cord pH: not collected  Head delivered ROA. No nuchal cord present. Shoulder and body delivered in usual fashion. Infant with spontaneous cry, placed on mother's abdomen, dried and bulb suctioned. Cord clamped x 2 after 1-minute delay, and cut by family member. Cord blood drawn. Placenta delivered spontaneously with gentle cord traction. Fundus firm with massage and Pitocin. Perineum inspected and found to have 2nd degree and L vaginal side wall lacerations, which were repaired with 3.0 vicryl with good hemostasis achieved.  Anesthesia: Epidural  Episiotomy: None Lacerations: 2nd degree;Perineal Suture Repair: 3.0 vicryl Est. Blood Loss (mL): 401  Mom to postpartum.  Baby to Couplet care / Skin to Skin.  Aura Camps 12/28/2018, 8:56 AM

## 2018-09-02 ENCOUNTER — Ambulatory Visit (HOSPITAL_COMMUNITY)
Admission: RE | Admit: 2018-09-02 | Discharge: 2018-09-02 | Disposition: A | Discharge status: Home / Self Care | Payer: Medicaid Other | Source: Ambulatory Visit | Attending: Obstetrics and Gynecology | Admitting: Obstetrics and Gynecology

## 2018-09-02 ENCOUNTER — Encounter: Payer: Self-pay | Admitting: Medical

## 2018-09-02 ENCOUNTER — Ambulatory Visit (INDEPENDENT_AMBULATORY_CARE_PROVIDER_SITE_OTHER): Payer: Medicaid Other | Admitting: Medical

## 2018-09-02 VITALS — BP 111/79 | HR 93 | Wt 135.6 lb

## 2018-09-02 DIAGNOSIS — Z362 Encounter for other antenatal screening follow-up: Secondary | ICD-10-CM | POA: Insufficient documentation

## 2018-09-02 DIAGNOSIS — Z3A23 23 weeks gestation of pregnancy: Secondary | ICD-10-CM

## 2018-09-02 DIAGNOSIS — O99019 Anemia complicating pregnancy, unspecified trimester: Principal | ICD-10-CM

## 2018-09-02 DIAGNOSIS — Z3480 Encounter for supervision of other normal pregnancy, unspecified trimester: Secondary | ICD-10-CM

## 2018-09-02 DIAGNOSIS — G43009 Migraine without aura, not intractable, without status migrainosus: Secondary | ICD-10-CM

## 2018-09-02 DIAGNOSIS — Z3482 Encounter for supervision of other normal pregnancy, second trimester: Secondary | ICD-10-CM

## 2018-09-02 DIAGNOSIS — D509 Iron deficiency anemia, unspecified: Secondary | ICD-10-CM

## 2018-09-02 DIAGNOSIS — O99012 Anemia complicating pregnancy, second trimester: Secondary | ICD-10-CM

## 2018-09-02 MED ORDER — FERROUS SULFATE 325 (65 FE) MG PO TABS: 325.0000 mg | ORAL_TABLET | Freq: Every day | ORAL | 1 refills | Status: DC

## 2018-09-02 MED ORDER — BUTALBITAL-APAP-CAFFEINE 50-325-40 MG PO TABS: 1.0000 | ORAL_TABLET | Freq: Four times a day (QID) | ORAL | 0 refills | Status: DC | PRN

## 2018-09-02 NOTE — Patient Instructions (Signed)

## 2018-09-02 NOTE — Progress Notes (Signed)
   PRENATAL VISIT NOTE  Subjective:  Emily Wilkins is a 28 y.o. G2P0010 at [redacted]w[redacted]d being seen today for ongoing prenatal care.  She is currently monitored for the following issues for this low-risk pregnancy and has Supervision of other normal pregnancy, antepartum and Iron deficiency anemia during pregnancy, antepartum on their problem list.  Patient reports backache and headache.  Contractions: Not present. Vag. Bleeding: None.  Movement: Present. Denies leaking of fluid.   The following portions of the patient's history were reviewed and updated as appropriate: allergies, current medications, past family history, past medical history, past social history, past surgical history and problem list. Problem list updated.  Objective:   Vitals:   09/02/18 1016  BP: 111/79  Pulse: 93  Weight: 135 lb 9.6 oz (61.5 kg)    Fetal Status: Fetal Heart Rate (bpm): 145 Fundal Height: 24 cm Movement: Present     General:  Alert, oriented and cooperative. Patient is in no acute distress.  Skin: Skin is warm and dry. No rash noted.   Cardiovascular: Normal heart rate noted  Respiratory: Normal respiratory effort, no problems with respiration noted  Abdomen: Soft, gravid, appropriate for gestational age.  Pain/Pressure: Present     Pelvic: Cervical exam deferred        Extremities: Normal range of motion.  Edema: None  Mental Status: Normal mood and affect. Normal behavior. Normal judgment and thought content.   Assessment and Plan:  Pregnancy: G2P0010 at [redacted]w[redacted]d  1. Supervision of other normal pregnancy, antepartum  2. Iron deficiency anemia during pregnancy, antepartum - ferrous sulfate (FERROUSUL) 325 (65 FE) MG tablet; Take 1 tablet (325 mg total) by mouth daily with breakfast.  Dispense: 60 tablet; Refill: 1  3. Migraine without aura and without status migrainosus, not intractable - Occasional migraine headaches recently, Tylenol not helping  - Encouraged increased PO hydration and abdominal  binder for back pain to avoid tension headache triggers - butalbital-acetaminophen-caffeine (FIORICET, ESGIC) 50-325-40 MG tablet; Take 1-2 tablets by mouth every 6 (six) hours as needed for headache.  Dispense: 20 tablet; Refill: 0  Preterm labor symptoms and general obstetric precautions including but not limited to vaginal bleeding, contractions, leaking of fluid and fetal movement were reviewed in detail with the patient. Please refer to After Visit Summary for other counseling recommendations.  Return in about 4 weeks (around 09/30/2018) for LOB, 28 week labs (fasting).  Future Appointments  Date Time Provider [place]  09/30/2018  9:00 AM [facility] None  09/30/2018  9:15 AM [place], Rona Ravens, NP [facility] None    Kerry Hough, PA-C

## 2018-09-02 NOTE — Progress Notes (Signed)
Pt presents for ROB c/o itchy rash on L side of her back x 2.5 wks. Pt also c/o insomnia.

## 2018-09-30 ENCOUNTER — Encounter: Payer: Self-pay | Admitting: Obstetrics and Gynecology

## 2018-09-30 ENCOUNTER — Other Ambulatory Visit: Payer: Medicaid Other

## 2018-09-30 ENCOUNTER — Ambulatory Visit (INDEPENDENT_AMBULATORY_CARE_PROVIDER_SITE_OTHER): Payer: Medicaid Other | Admitting: Obstetrics and Gynecology

## 2018-09-30 DIAGNOSIS — Z3482 Encounter for supervision of other normal pregnancy, second trimester: Secondary | ICD-10-CM

## 2018-09-30 DIAGNOSIS — Z3480 Encounter for supervision of other normal pregnancy, unspecified trimester: Secondary | ICD-10-CM

## 2018-09-30 NOTE — Progress Notes (Signed)
Subjective:  Emily Wilkins is a 28 y.o. G2P0010 at [redacted]w[redacted]d being seen today for ongoing prenatal care.  She is currently monitored for the following issues for this low-risk pregnancy and has Supervision of other normal pregnancy, antepartum and Iron deficiency anemia during pregnancy, antepartum on their problem list.  Patient reports no complaints.  Contractions: Not present. Vag. Bleeding: None.  Movement: Present. Denies leaking of fluid.   The following portions of the patient's history were reviewed and updated as appropriate: allergies, current medications, past family history, past medical history, past social history, past surgical history and problem list. Problem list updated.  Objective:   Vitals:   09/30/18 0914  BP: 121/81  Pulse: 97  Weight: 141 lb 6.4 oz (64.1 kg)    Fetal Status:     Movement: Present     General:  Alert, oriented and cooperative. Patient is in no acute distress.  Skin: Skin is warm and dry. No rash noted.   Cardiovascular: Normal heart rate noted  Respiratory: Normal respiratory effort, no problems with respiration noted  Abdomen: Soft, gravid, appropriate for gestational age. Pain/Pressure: Present     Pelvic:  Cervical exam deferred        Extremities: Normal range of motion.  Edema: None  Mental Status: Normal mood and affect. Normal behavior. Normal judgment and thought content.   Urinalysis:      Assessment and Plan:  Pregnancy: G2P0010 at [redacted]w[redacted]d  1. Supervision of other normal pregnancy, antepartum Glucola next week  Preterm labor symptoms and general obstetric precautions including but not limited to vaginal bleeding, contractions, leaking of fluid and fetal movement were reviewed in detail with the patient. Please refer to After Visit Summary for other counseling recommendations.  Return in about 2 weeks (around 10/14/2018) for OB visit.   Chancy Milroy, MD

## 2018-09-30 NOTE — Progress Notes (Signed)
Pt presents for ROB. Unable to complete 2 gtt; pt forgot to fast.

## 2018-09-30 NOTE — Patient Instructions (Signed)
Third Trimester of Pregnancy The third trimester is from week 28 through week 40 (months 7 through 9). The third trimester is a time when the unborn baby (fetus) is growing rapidly. At the end of the ninth month, the fetus is about 20 inches in length and weighs 6-10 pounds. Body changes during your third trimester Your body will continue to go through many changes during pregnancy. The changes vary from woman to woman. During the third trimester:  Your weight will continue to increase. You can expect to gain 25-35 pounds (11-16 kg) by the end of the pregnancy.  You may begin to get stretch marks on your hips, abdomen, and breasts.  You may urinate more often because the fetus is moving lower into your pelvis and pressing on your bladder.  You may develop or continue to have heartburn. This is caused by increased hormones that slow down muscles in the digestive tract.  You may develop or continue to have constipation because increased hormones slow digestion and cause the muscles that push waste through your intestines to relax.  You may develop hemorrhoids. These are swollen veins (varicose veins) in the rectum that can itch or be painful.  You may develop swollen, bulging veins (varicose veins) in your legs.  You may have increased body aches in the pelvis, back, or thighs. This is due to weight gain and increased hormones that are relaxing your joints.  You may have changes in your hair. These can include thickening of your hair, rapid growth, and changes in texture. Some women also have hair loss during or after pregnancy, or hair that feels dry or thin. Your hair will most likely return to normal after your baby is born.  Your breasts will continue to grow and they will continue to become tender. A yellow fluid (colostrum) may leak from your breasts. This is the first milk you are producing for your baby.  Your belly button may stick out.  You may notice more swelling in your hands,  face, or ankles.  You may have increased tingling or numbness in your hands, arms, and legs. The skin on your belly may also feel numb.  You may feel short of breath because of your expanding uterus.  You may have more problems sleeping. This can be caused by the size of your belly, increased need to urinate, and an increase in your body's metabolism.  You may notice the fetus "dropping," or moving lower in your abdomen (lightening).  You may have increased vaginal discharge.  You may notice your joints feel loose and you may have pain around your pelvic bone. What to expect at prenatal visits You will have prenatal exams every 2 weeks until week 36. Then you will have weekly prenatal exams. During a routine prenatal visit:  You will be weighed to make sure you and the baby are growing normally.  Your blood pressure will be taken.  Your abdomen will be measured to track your baby's growth.  The fetal heartbeat will be listened to.  Any test results from the previous visit will be discussed.  You may have a cervical check near your due date to see if your cervix has softened or thinned (effaced).  You will be tested for Group B streptococcus. This happens between 35 and 37 weeks. Your health care provider may ask you:  What your birth plan is.  How you are feeling.  If you are feeling the baby move.  If you have had any abnormal   symptoms, such as leaking fluid, bleeding, severe headaches, or abdominal cramping.  If you are using any tobacco products, including cigarettes, chewing tobacco, and electronic cigarettes.  If you have any questions. Other tests or screenings that may be performed during your third trimester include:  Blood tests that check for low iron levels (anemia).  Fetal testing to check the health, activity level, and growth of the fetus. Testing is done if you have certain medical conditions or if there are problems during the pregnancy.  Nonstress test  (NST). This test checks the health of your baby to make sure there are no signs of problems, such as the baby not getting enough oxygen. During this test, a belt is placed around your belly. The baby is made to move, and its heart rate is monitored during movement. What is false labor? False labor is a condition in which you feel small, irregular tightenings of the muscles in the womb (contractions) that usually go away with rest, changing position, or drinking water. These are called Braxton Hicks contractions. Contractions may last for hours, days, or even weeks before true labor sets in. If contractions come at regular intervals, become more frequent, increase in intensity, or become painful, you should see your health care provider. What are the signs of labor?  Abdominal cramps.  Regular contractions that start at 10 minutes apart and become stronger and more frequent with time.  Contractions that start on the top of the uterus and spread down to the lower abdomen and back.  Increased pelvic pressure and dull back pain.  A watery or bloody mucus discharge that comes from the vagina.  Leaking of amniotic fluid. This is also known as your "water breaking." It could be a slow trickle or a gush. Let your health care provider know if it has a color or strange odor. If you have any of these signs, call your health care provider right away, even if it is before your due date. Follow these instructions at home: Medicines  Follow your health care provider's instructions regarding medicine use. Specific medicines may be either safe or unsafe to take during pregnancy.  Take a prenatal vitamin that contains at least 600 micrograms (mcg) of folic acid.  If you develop constipation, try taking a stool softener if your health care provider approves. Eating and drinking   Eat a balanced diet that includes fresh fruits and vegetables, whole grains, good sources of protein such as meat, eggs, or tofu,  and low-fat dairy. Your health care provider will help you determine the amount of weight gain that is right for you.  Avoid raw meat and uncooked cheese. These carry germs that can cause birth defects in the baby.  If you have low calcium intake from food, talk to your health care provider about whether you should take a daily calcium supplement.  Eat four or five small meals rather than three large meals a day.  Limit foods that are high in fat and processed sugars, such as fried and sweet foods.  To prevent constipation: ? Drink enough fluid to keep your urine clear or pale yellow. ? Eat foods that are high in fiber, such as fresh fruits and vegetables, whole grains, and beans. Activity  Exercise only as directed by your health care provider. Most women can continue their usual exercise routine during pregnancy. Try to exercise for 30 minutes at least 5 days a week. Stop exercising if you experience uterine contractions.  Avoid heavy lifting.  Do   not exercise in extreme heat or humidity, or at high altitudes.  Wear low-heel, comfortable shoes.  Practice good posture.  You may continue to have sex unless your health care provider tells you otherwise. Relieving pain and discomfort  Take frequent breaks and rest with your legs elevated if you have leg cramps or low back pain.  Take warm sitz baths to soothe any pain or discomfort caused by hemorrhoids. Use hemorrhoid cream if your health care provider approves.  Wear a good support bra to prevent discomfort from breast tenderness.  If you develop varicose veins: ? Wear support pantyhose or compression stockings as told by your healthcare provider. ? Elevate your feet for 15 minutes, 3-4 times a day. Prenatal care  Write down your questions. Take them to your prenatal visits.  Keep all your prenatal visits as told by your health care provider. This is important. Safety  Wear your seat belt at all times when driving.  Make  a list of emergency phone numbers, including numbers for family, friends, the hospital, and police and fire departments. General instructions  Avoid cat litter boxes and soil used by cats. These carry germs that can cause birth defects in the baby. If you have a cat, ask someone to clean the litter box for you.  Do not travel far distances unless it is absolutely necessary and only with the approval of your health care provider.  Do not use hot tubs, steam rooms, or saunas.  Do not drink alcohol.  Do not use any products that contain nicotine or tobacco, such as cigarettes and e-cigarettes. If you need help quitting, ask your health care provider.  Do not use any medicinal herbs or unprescribed drugs. These chemicals affect the formation and growth of the baby.  Do not douche or use tampons or scented sanitary pads.  Do not cross your legs for long periods of time.  To prepare for the arrival of your baby: ? Take prenatal classes to understand, practice, and ask questions about labor and delivery. ? Make a trial run to the hospital. ? Visit the hospital and tour the maternity area. ? Arrange for maternity or paternity leave through employers. ? Arrange for family and friends to take care of pets while you are in the hospital. ? Purchase a rear-facing car seat and make sure you know how to install it in your car. ? Pack your hospital bag. ? Prepare the baby's nursery. Make sure to remove all pillows and stuffed animals from the baby's crib to prevent suffocation.  Visit your dentist if you have not gone during your pregnancy. Use a soft toothbrush to brush your teeth and be gentle when you floss. Contact a health care provider if:  You are unsure if you are in labor or if your water has broken.  You become dizzy.  You have mild pelvic cramps, pelvic pressure, or nagging pain in your abdominal area.  You have lower back pain.  You have persistent nausea, vomiting, or  diarrhea.  You have an unusual or bad smelling vaginal discharge.  You have pain when you urinate. Get help right away if:  Your water breaks before 37 weeks.  You have regular contractions less than 5 minutes apart before 37 weeks.  You have a fever.  You are leaking fluid from your vagina.  You have spotting or bleeding from your vagina.  You have severe abdominal pain or cramping.  You have rapid weight loss or weight gain.  You have   shortness of breath with chest pain.  You notice sudden or extreme swelling of your face, hands, ankles, feet, or legs.  Your baby makes fewer than 10 movements in 2 hours.  You have severe headaches that do not go away when you take medicine.  You have vision changes. Summary  The third trimester is from week 28 through week 40, months 7 through 9. The third trimester is a time when the unborn baby (fetus) is growing rapidly.  During the third trimester, your discomfort may increase as you and your baby continue to gain weight. You may have abdominal, leg, and back pain, sleeping problems, and an increased need to urinate.  During the third trimester your breasts will keep growing and they will continue to become tender. A yellow fluid (colostrum) may leak from your breasts. This is the first milk you are producing for your baby.  False labor is a condition in which you feel small, irregular tightenings of the muscles in the womb (contractions) that eventually go away. These are called Braxton Hicks contractions. Contractions may last for hours, days, or even weeks before true labor sets in.  Signs of labor can include: abdominal cramps; regular contractions that start at 10 minutes apart and become stronger and more frequent with time; watery or bloody mucus discharge that comes from the vagina; increased pelvic pressure and dull back pain; and leaking of amniotic fluid. This information is not intended to replace advice given to you by your  health care provider. Make sure you discuss any questions you have with your health care provider. Document Released: 08/12/2001 Document Revised: 09/23/2016 Document Reviewed: 09/23/2016 Elsevier Interactive Patient Education  2019 Elsevier Inc[employer]  

## 2018-10-06 ENCOUNTER — Other Ambulatory Visit: Payer: Self-pay

## 2018-10-06 ENCOUNTER — Other Ambulatory Visit: Payer: Medicaid Other

## 2018-10-06 DIAGNOSIS — Z3480 Encounter for supervision of other normal pregnancy, unspecified trimester: Secondary | ICD-10-CM

## 2018-10-07 LAB — CBC
Hematocrit: 29.7 % — ABNORMAL LOW (ref 34.0–46.6)
Hemoglobin: 8.7 g/dL — ABNORMAL LOW (ref 11.1–15.9)
MCH: 20.1 pg — AB (ref 26.6–33.0)
MCHC: 29.3 g/dL — ABNORMAL LOW (ref 31.5–35.7)
MCV: 69 fL — ABNORMAL LOW (ref 79–97)
Platelets: 222 10*3/uL (ref 150–450)
RBC: 4.33 x10E6/uL (ref 3.77–5.28)
RDW: 16.3 % — ABNORMAL HIGH (ref 11.7–15.4)
WBC: 8.3 10*3/uL (ref 3.4–10.8)

## 2018-10-07 LAB — GLUCOSE TOLERANCE, 2 HOURS W/ 1HR
GLUCOSE, 1 HOUR: 99 mg/dL (ref 65–179)
Glucose, 2 hour: 90 mg/dL (ref 65–152)
Glucose, Fasting: 80 mg/dL (ref 65–91)

## 2018-10-07 LAB — RPR: RPR Ser Ql: NONREACTIVE

## 2018-10-07 LAB — HIV ANTIBODY (ROUTINE TESTING W REFLEX): HIV Screen 4th Generation wRfx: NONREACTIVE

## 2018-10-14 ENCOUNTER — Encounter: Payer: Self-pay | Admitting: Obstetrics and Gynecology

## 2018-10-14 ENCOUNTER — Ambulatory Visit (INDEPENDENT_AMBULATORY_CARE_PROVIDER_SITE_OTHER): Payer: Medicaid Other | Admitting: Obstetrics and Gynecology

## 2018-10-14 ENCOUNTER — Ambulatory Visit (INDEPENDENT_AMBULATORY_CARE_PROVIDER_SITE_OTHER): Payer: Medicaid Other | Admitting: Clinical

## 2018-10-14 ENCOUNTER — Other Ambulatory Visit: Payer: Self-pay

## 2018-10-14 VITALS — BP 100/69 | HR 94 | Wt 144.2 lb

## 2018-10-14 DIAGNOSIS — Z3483 Encounter for supervision of other normal pregnancy, third trimester: Secondary | ICD-10-CM

## 2018-10-14 DIAGNOSIS — F32A Depression, unspecified: Secondary | ICD-10-CM | POA: Insufficient documentation

## 2018-10-14 DIAGNOSIS — F4323 Adjustment disorder with mixed anxiety and depressed mood: Secondary | ICD-10-CM | POA: Diagnosis not present

## 2018-10-14 DIAGNOSIS — Z23 Encounter for immunization: Secondary | ICD-10-CM | POA: Diagnosis not present

## 2018-10-14 DIAGNOSIS — O99343 Other mental disorders complicating pregnancy, third trimester: Secondary | ICD-10-CM

## 2018-10-14 DIAGNOSIS — Z3480 Encounter for supervision of other normal pregnancy, unspecified trimester: Secondary | ICD-10-CM

## 2018-10-14 DIAGNOSIS — F329 Major depressive disorder, single episode, unspecified: Secondary | ICD-10-CM | POA: Insufficient documentation

## 2018-10-14 MED ORDER — SERTRALINE HCL 50 MG PO TABS: 50.0000 mg | ORAL_TABLET | Freq: Every day | ORAL | 3 refills | Status: DC

## 2018-10-14 NOTE — Progress Notes (Signed)
   PRENATAL VISIT NOTE  Subjective:  Emily Wilkins is a 28 y.o. G2P0010 at [redacted]w[redacted]d being seen today for ongoing prenatal care.  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  Patient reports feelings of depression for the past few months but has been afraid to discuss it previously as her family does not believe that depression is "real". She admits to crying spells and periods of sadness and insomnia. She denies suicidal or homicidal ideations.  Contractions: Not present. Vag. Bleeding: None.  Movement: Present. Denies leaking of fluid.   The following portions of the patient's history were reviewed and updated as appropriate: allergies, current medications, past family history, past medical history, past social history, past surgical history and problem list. Problem list updated.  Objective:   Vitals:   10/14/18 1120  BP: 100/69  Pulse: 94  Weight: 144 lb 3.2 oz (65.4 kg)    Fetal Status: Fetal Heart Rate (bpm): 135 Fundal Height: 29 cm Movement: Present     General:  Alert, oriented and cooperative. Patient is in no acute distress.  Skin: Skin is warm and dry. No rash noted.   Cardiovascular: Normal heart rate noted  Respiratory: Normal respiratory effort, no problems with respiration noted  Abdomen: Soft, gravid, appropriate for gestational age.  Pain/Pressure: Present     Pelvic: Cervical exam deferred        Extremities: Normal range of motion.  Edema: Trace  Mental Status: Normal mood and affect. Normal behavior. Normal judgment and thought content.   Assessment and Plan:  Pregnancy: G2P0010 at [redacted]w[redacted]d  1. Supervision of other normal pregnancy, antepartum Patient is doing well Tdap today - Amb ref to [place]  2. Depression  affecting pregnancy in third trimester, antepartum Patient referred to integrated behavioral health Rx zoloft provided  Preterm labor symptoms and general obstetric precautions including but not limited to vaginal bleeding, contractions, leaking of fluid and fetal movement were reviewed in detail with the patient. Please refer to After Visit Summary for other counseling recommendations.  Return in about 2 weeks (around 10/28/2018) for [place].  No future appointments.  Mora Bellman, MD

## 2018-10-14 NOTE — Progress Notes (Signed)
ROB.  TDAP given in RD, tolerated well.

## 2018-10-14 NOTE — BH Specialist Note (Signed)
Integrated Behavioral Health Initial Visit  MRN: [id number] Name: Emily Wilkins  Number of [place] Clinician visits:: 1/6 Session Start time: 1:20  Session End time: 2:19 Total time: 1 hour  Type of Service: [place] Interpretor:No. Interpretor Name and Language: n/a   Warm Hand Off Completed.       SUBJECTIVE: Emily Wilkins is a 28 y.o. female accompanied by n/a Patient was referred by Emily Bellman, MD for depression Patient reports the following symptoms/concerns: Pt states her primary concern today is feeling anxious, panicky, depressed, fatigue, nervous, and irritable,  that began in pregnancy; pt copes by talking herself through her feelings.  Duration of problem: Current pregnancy; Severity of problem: moderate  OBJECTIVE: Mood: Anxious and Affect: Appropriate Risk of harm to self or others: No plan to harm self or others  LIFE CONTEXT: Family and Social: Pt lives with her husband; family is supportive School/Work: Pt currently unemployed; husband works Self-Care: Recognized a greater need for self-care Life Changes: Current pregnancy   GOALS ADDRESSED: Patient will: 1. Reduce symptoms of: anxiety and depression 2. Increase knowledge and/or ability of: healthy habits and self-management skills  3. Demonstrate ability to: Increase healthy adjustment to current life circumstances, Increase adequate support systems for patient/family and Increase motivation to adhere to plan of care  INTERVENTIONS: Interventions utilized: Mindfulness or Psychologist, educational, Veterinary surgeon, Psychoeducation and/or Health Education and Link to [employer]  Standardized Assessments completed: Not given today  ASSESSMENT: Patient currently experiencing Adjustment disorder with mixed anxious and depressed mood.   Patient may benefit from psychoeducation and brief therapeutic interventions regarding coping with  symptoms of anxiety with panic attacks and depression .  PLAN: 1. Follow up with behavioral health clinician on : Two weeks (f/u [place] med management call by 10/18/18) 2. Behavioral recommendations:  -Begin taking Zoloft and iron, as prescribed by medical provider -CALM relaxation breathing exercise twice daily (morning; evening) -Try out different apps to find one that helps the most for additional self-coping (as discussed) -Read educational materials regarding coping with symptoms of anxiety with panic attacks, depression, and BH medications -Take home [facility] Postpartum Planner; share with family to begin planning for postpartum time 3. Referral(s): [place] (In Clinic) and [employer] Resources:  Options for mom support postpartum 4. "From scale of 1-10, how likely are you to follow plan?": [place], LCSW

## 2018-10-18 ENCOUNTER — Telehealth: Payer: Self-pay | Admitting: Clinical

## 2018-10-18 NOTE — Telephone Encounter (Signed)
  Left HIPPA-compliant message to call back Roselyn Reef from [facility] [employer]  at [phone].    Integrated Behavioral Health Medication Management Phone Note   Garlan Fair, LCSW

## 2018-10-27 ENCOUNTER — Encounter: Payer: Self-pay | Admitting: Obstetrics and Gynecology

## 2018-10-27 ENCOUNTER — Ambulatory Visit: Payer: Medicaid Other

## 2018-10-27 ENCOUNTER — Ambulatory Visit (INDEPENDENT_AMBULATORY_CARE_PROVIDER_SITE_OTHER): Payer: Medicaid Other | Admitting: Obstetrics and Gynecology

## 2018-10-27 VITALS — BP 105/69 | HR 90 | Wt 144.0 lb

## 2018-10-27 DIAGNOSIS — Z3A31 31 weeks gestation of pregnancy: Secondary | ICD-10-CM

## 2018-10-27 DIAGNOSIS — F329 Major depressive disorder, single episode, unspecified: Secondary | ICD-10-CM

## 2018-10-27 DIAGNOSIS — O99343 Other mental disorders complicating pregnancy, third trimester: Secondary | ICD-10-CM

## 2018-10-27 DIAGNOSIS — Z3480 Encounter for supervision of other normal pregnancy, unspecified trimester: Secondary | ICD-10-CM

## 2018-10-27 DIAGNOSIS — Z3483 Encounter for supervision of other normal pregnancy, third trimester: Secondary | ICD-10-CM

## 2018-10-27 DIAGNOSIS — F32A Depression, unspecified: Secondary | ICD-10-CM

## 2018-10-27 NOTE — Progress Notes (Signed)
Subjective:  Emily Wilkins is a 28 y.o. G2P0010 at [redacted]w[redacted]d being seen today for ongoing prenatal care.  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  Patient reports no complaints.  Contractions: Not present. Vag. Bleeding: None.  Movement: Present. Denies leaking of fluid.   The following portions of the patient's history were reviewed and updated as appropriate: allergies, current medications, past family history, past medical history, past social history, past surgical history and problem list. Problem list updated.  Objective:   Vitals:   10/27/18 1303  BP: 105/69  Pulse: 90  Weight: 144 lb (65.3 kg)    Fetal Status: Fetal Heart Rate (bpm): 130   Movement: Present     General:  Alert, oriented and cooperative. Patient is in no acute distress.  Skin: Skin is warm and dry. No rash noted.   Cardiovascular: Normal heart rate noted  Respiratory: Normal respiratory effort, no problems with respiration noted  Abdomen: Soft, gravid, appropriate for gestational age. Pain/Pressure: Present     Pelvic:  Cervical exam deferred        Extremities: Normal range of motion.     Mental Status: Normal mood and affect. Normal behavior. Normal judgment and thought content.   Urinalysis:      Assessment and Plan:  Pregnancy: G2P0010 at [redacted]w[redacted]d  1. Supervision of other normal pregnancy, antepartum Stable  2. 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 Please refer to After Visit Summary for other counseling recommendations.   Return in about 2 weeks (around 11/10/2018) for OB visit.   Chancy Milroy, MD

## 2018-10-27 NOTE — Patient Instructions (Signed)
Third Trimester of Pregnancy The third trimester is from week 28 through week 40 (months 7 through 9). The third trimester is a time when the unborn baby (fetus) is growing rapidly. At the end of the ninth month, the fetus is about 20 inches in length and weighs 6-10 pounds. Body changes during your third trimester Your body will continue to go through many changes during pregnancy. The changes vary from woman to woman. During the third trimester:  Your weight will continue to increase. You can expect to gain 25-35 pounds (11-16 kg) by the end of the pregnancy.  You may begin to get stretch marks on your hips, abdomen, and breasts.  You may urinate more often because the fetus is moving lower into your pelvis and pressing on your bladder.  You may develop or continue to have heartburn. This is caused by increased hormones that slow down muscles in the digestive tract.  You may develop or continue to have constipation because increased hormones slow digestion and cause the muscles that push waste through your intestines to relax.  You may develop hemorrhoids. These are swollen veins (varicose veins) in the rectum that can itch or be painful.  You may develop swollen, bulging veins (varicose veins) in your legs.  You may have increased body aches in the pelvis, back, or thighs. This is due to weight gain and increased hormones that are relaxing your joints.  You may have changes in your hair. These can include thickening of your hair, rapid growth, and changes in texture. Some women also have hair loss during or after pregnancy, or hair that feels dry or thin. Your hair will most likely return to normal after your baby is born.  Your breasts will continue to grow and they will continue to become tender. A yellow fluid (colostrum) may leak from your breasts. This is the first milk you are producing for your baby.  Your belly button may stick out.  You may notice more swelling in your hands,  face, or ankles.  You may have increased tingling or numbness in your hands, arms, and legs. The skin on your belly may also feel numb.  You may feel short of breath because of your expanding uterus.  You may have more problems sleeping. This can be caused by the size of your belly, increased need to urinate, and an increase in your body's metabolism.  You may notice the fetus "dropping," or moving lower in your abdomen (lightening).  You may have increased vaginal discharge.  You may notice your joints feel loose and you may have pain around your pelvic bone. What to expect at prenatal visits You will have prenatal exams every 2 weeks until week 36. Then you will have weekly prenatal exams. During a routine prenatal visit:  You will be weighed to make sure you and the baby are growing normally.  Your blood pressure will be taken.  Your abdomen will be measured to track your baby's growth.  The fetal heartbeat will be listened to.  Any test results from the previous visit will be discussed.  You may have a cervical check near your due date to see if your cervix has softened or thinned (effaced).  You will be tested for Group B streptococcus. This happens between 35 and 37 weeks. Your health care provider may ask you:  What your birth plan is.  How you are feeling.  If you are feeling the baby move.  If you have had any abnormal   symptoms, such as leaking fluid, bleeding, severe headaches, or abdominal cramping.  If you are using any tobacco products, including cigarettes, chewing tobacco, and electronic cigarettes.  If you have any questions. Other tests or screenings that may be performed during your third trimester include:  Blood tests that check for low iron levels (anemia).  Fetal testing to check the health, activity level, and growth of the fetus. Testing is done if you have certain medical conditions or if there are problems during the pregnancy.  Nonstress test  (NST). This test checks the health of your baby to make sure there are no signs of problems, such as the baby not getting enough oxygen. During this test, a belt is placed around your belly. The baby is made to move, and its heart rate is monitored during movement. What is false labor? False labor is a condition in which you feel small, irregular tightenings of the muscles in the womb (contractions) that usually go away with rest, changing position, or drinking water. These are called Braxton Hicks contractions. Contractions may last for hours, days, or even weeks before true labor sets in. If contractions come at regular intervals, become more frequent, increase in intensity, or become painful, you should see your health care provider. What are the signs of labor?  Abdominal cramps.  Regular contractions that start at 10 minutes apart and become stronger and more frequent with time.  Contractions that start on the top of the uterus and spread down to the lower abdomen and back.  Increased pelvic pressure and dull back pain.  A watery or bloody mucus discharge that comes from the vagina.  Leaking of amniotic fluid. This is also known as your "water breaking." It could be a slow trickle or a gush. Let your health care provider know if it has a color or strange odor. If you have any of these signs, call your health care provider right away, even if it is before your due date. Follow these instructions at home: Medicines  Follow your health care provider's instructions regarding medicine use. Specific medicines may be either safe or unsafe to take during pregnancy.  Take a prenatal vitamin that contains at least 600 micrograms (mcg) of folic acid.  If you develop constipation, try taking a stool softener if your health care provider approves. Eating and drinking   Eat a balanced diet that includes fresh fruits and vegetables, whole grains, good sources of protein such as meat, eggs, or tofu,  and low-fat dairy. Your health care provider will help you determine the amount of weight gain that is right for you.  Avoid raw meat and uncooked cheese. These carry germs that can cause birth defects in the baby.  If you have low calcium intake from food, talk to your health care provider about whether you should take a daily calcium supplement.  Eat four or five small meals rather than three large meals a day.  Limit foods that are high in fat and processed sugars, such as fried and sweet foods.  To prevent constipation: ? Drink enough fluid to keep your urine clear or pale yellow. ? Eat foods that are high in fiber, such as fresh fruits and vegetables, whole grains, and beans. Activity  Exercise only as directed by your health care provider. Most women can continue their usual exercise routine during pregnancy. Try to exercise for 30 minutes at least 5 days a week. Stop exercising if you experience uterine contractions.  Avoid heavy lifting.  Do   not exercise in extreme heat or humidity, or at high altitudes.  Wear low-heel, comfortable shoes.  Practice good posture.  You may continue to have sex unless your health care provider tells you otherwise. Relieving pain and discomfort  Take frequent breaks and rest with your legs elevated if you have leg cramps or low back pain.  Take warm sitz baths to soothe any pain or discomfort caused by hemorrhoids. Use hemorrhoid cream if your health care provider approves.  Wear a good support bra to prevent discomfort from breast tenderness.  If you develop varicose veins: ? Wear support pantyhose or compression stockings as told by your healthcare provider. ? Elevate your feet for 15 minutes, 3-4 times a day. Prenatal care  Write down your questions. Take them to your prenatal visits.  Keep all your prenatal visits as told by your health care provider. This is important. Safety  Wear your seat belt at all times when driving.  Make  a list of emergency phone numbers, including numbers for family, friends, the hospital, and police and fire departments. General instructions  Avoid cat litter boxes and soil used by cats. These carry germs that can cause birth defects in the baby. If you have a cat, ask someone to clean the litter box for you.  Do not travel far distances unless it is absolutely necessary and only with the approval of your health care provider.  Do not use hot tubs, steam rooms, or saunas.  Do not drink alcohol.  Do not use any products that contain nicotine or tobacco, such as cigarettes and e-cigarettes. If you need help quitting, ask your health care provider.  Do not use any medicinal herbs or unprescribed drugs. These chemicals affect the formation and growth of the baby.  Do not douche or use tampons or scented sanitary pads.  Do not cross your legs for long periods of time.  To prepare for the arrival of your baby: ? Take prenatal classes to understand, practice, and ask questions about labor and delivery. ? Make a trial run to the hospital. ? Visit the hospital and tour the maternity area. ? Arrange for maternity or paternity leave through employers. ? Arrange for family and friends to take care of pets while you are in the hospital. ? Purchase a rear-facing car seat and make sure you know how to install it in your car. ? Pack your hospital bag. ? Prepare the baby's nursery. Make sure to remove all pillows and stuffed animals from the baby's crib to prevent suffocation.  Visit your dentist if you have not gone during your pregnancy. Use a soft toothbrush to brush your teeth and be gentle when you floss. Contact a health care provider if:  You are unsure if you are in labor or if your water has broken.  You become dizzy.  You have mild pelvic cramps, pelvic pressure, or nagging pain in your abdominal area.  You have lower back pain.  You have persistent nausea, vomiting, or  diarrhea.  You have an unusual or bad smelling vaginal discharge.  You have pain when you urinate. Get help right away if:  Your water breaks before 37 weeks.  You have regular contractions less than 5 minutes apart before 37 weeks.  You have a fever.  You are leaking fluid from your vagina.  You have spotting or bleeding from your vagina.  You have severe abdominal pain or cramping.  You have rapid weight loss or weight gain.  You have   shortness of breath with chest pain.  You notice sudden or extreme swelling of your face, hands, ankles, feet, or legs.  Your baby makes fewer than 10 movements in 2 hours.  You have severe headaches that do not go away when you take medicine.  You have vision changes. Summary  The third trimester is from week 28 through week 40, months 7 through 9. The third trimester is a time when the unborn baby (fetus) is growing rapidly.  During the third trimester, your discomfort may increase as you and your baby continue to gain weight. You may have abdominal, leg, and back pain, sleeping problems, and an increased need to urinate.  During the third trimester your breasts will keep growing and they will continue to become tender. A yellow fluid (colostrum) may leak from your breasts. This is the first milk you are producing for your baby.  False labor is a condition in which you feel small, irregular tightenings of the muscles in the womb (contractions) that eventually go away. These are called Braxton Hicks contractions. Contractions may last for hours, days, or even weeks before true labor sets in.  Signs of labor can include: abdominal cramps; regular contractions that start at 10 minutes apart and become stronger and more frequent with time; watery or bloody mucus discharge that comes from the vagina; increased pelvic pressure and dull back pain; and leaking of amniotic fluid. This information is not intended to replace advice given to you by your  health care provider. Make sure you discuss any questions you have with your health care provider. Document Released: 08/12/2001 Document Revised: 09/23/2016 Document Reviewed: 09/23/2016 Elsevier Interactive Patient Education  2019 Elsevier Inc[employer]  

## 2018-10-28 ENCOUNTER — Encounter (HOSPITAL_COMMUNITY): Payer: Self-pay

## 2018-10-28 ENCOUNTER — Other Ambulatory Visit: Payer: Self-pay

## 2018-10-28 ENCOUNTER — Inpatient Hospital Stay (HOSPITAL_COMMUNITY)
Admission: AD | Admit: 2018-10-28 | Discharge: 2018-10-29 | Disposition: A | Discharge status: Home / Self Care | Payer: Medicaid Other | Attending: Obstetrics & Gynecology | Admitting: Obstetrics & Gynecology

## 2018-10-28 DIAGNOSIS — N949 Unspecified condition associated with female genital organs and menstrual cycle: Secondary | ICD-10-CM

## 2018-10-28 DIAGNOSIS — O26893 Other specified pregnancy related conditions, third trimester: Secondary | ICD-10-CM | POA: Diagnosis not present

## 2018-10-28 DIAGNOSIS — O368130 Decreased fetal movements, third trimester, not applicable or unspecified: Secondary | ICD-10-CM | POA: Insufficient documentation

## 2018-10-28 DIAGNOSIS — Z3A31 31 weeks gestation of pregnancy: Secondary | ICD-10-CM | POA: Diagnosis not present

## 2018-10-28 DIAGNOSIS — Z87891 Personal history of nicotine dependence: Secondary | ICD-10-CM | POA: Diagnosis not present

## 2018-10-28 LAB — URINALYSIS, ROUTINE W REFLEX MICROSCOPIC
Bilirubin Urine: NEGATIVE
Glucose, UA: NEGATIVE mg/dL
Hgb urine dipstick: NEGATIVE
Ketones, ur: NEGATIVE mg/dL
Leukocytes,Ua: NEGATIVE
Nitrite: NEGATIVE
Protein, ur: NEGATIVE mg/dL
SPECIFIC GRAVITY, URINE: 1.018 (ref 1.005–1.030)
pH: 5 (ref 5.0–8.0)

## 2018-10-28 NOTE — MAU Note (Signed)
Pt presents to MAU with c/o lower abdominal pain that started yesterday and has gotten worse today. She has had ongoing pressure. Pt denies VB and LOF. She also states that the baby has not been moving as much today.

## 2018-10-28 NOTE — MAU Provider Note (Signed)
None     Chief Complaint:  Abdominal Pain and Decreased Fetal Movement   Emily Wilkins is  28 y.o. G2P0010 at [redacted]w[redacted]d presents complaining of Abdominal Pain and Decreased Fetal Movement . Baby is very active now.  PT reassured.  Has had pelvic pressure/ bilateral pelvic pain intermittently since last night. Also occ cramping.  Had intercourse yesterday. Denies bleeding/ROM.   Obstetrical/Gynecological History: OB History    Gravida  2   Para  0   Term  0   Preterm  0   AB  1   Living  0     SAB  0   TAB  0   Ectopic  0   Multiple  0   Live Births  0          Past Medical History: Past Medical History:  Diagnosis Date  . Allergy   . Anxiety     Past Surgical History: Past Surgical History:  Procedure Laterality Date  . tonsilect    . TONSILLECTOMY      Family History: Family History  Problem Relation Age of Onset  . ALS Father     Social History: Social History   Tobacco Use  . Smoking status: Former Smoker    Types: Pipe  . Smokeless tobacco: Never Used  Substance Use Topics  . Alcohol use: No  . Drug use: No    Allergies: No Known Allergies  Meds:  Medications Prior to Admission  Medication Sig Dispense Refill Last Dose  . ferrous sulfate (FERROUSUL) 325 (65 FE) MG tablet Take 1 tablet (325 mg total) by mouth daily with breakfast. (Patient not taking: Reported on 09/30/2018) 60 tablet 1 Not Taking  . Prenatal Vit-Fe Fumarate-FA (PRENATAL MULTIVITAMIN) TABS tablet Take 1 tablet by mouth daily at 12 noon.   Taking  . sertraline (ZOLOFT) 50 MG tablet Take 1 tablet (50 mg total) by mouth daily. 30 tablet 3     Review of Systems   Constitutional: Negative for fever and chills Eyes: Negative for visual disturbances Respiratory: Negative for shortness of breath, dyspnea Cardiovascular: Negative for chest pain or palpitations  Gastrointestinal: Negative for vomiting, diarrhea and constipation Genitourinary: Negative for dysuria and  urgency Musculoskeletal: Negative for back pain, joint pain, myalgias.  Normal ROM  Neurological: Negative for dizziness and headaches    Physical Exam  Blood pressure 108/67, pulse 90, temperature 98.5 F (36.9 C), temperature source Oral, resp. rate 18, weight 67.6 kg, last menstrual period 03/19/2018, SpO2 100 %. GENERAL: Well-developed, well-nourished female in no acute distress.  LUNGS: Normal respiratory effort HEART: Regular rate and rhythm. ABDOMEN: Soft, nontender, nondistended, gravid.  EXTREMITIES: Nontender, no edema, 2+ distal pulses. DTR's 2+ CERVICAL EXAM: Dilatation 0cm   Effacement 0%   Station not in pelvis   FHT:  Baseline rate 150 bpm   Variability moderate  Accelerations present   Decelerations none Contractions: Every 0 mins   Labs: No results found for this or any previous visit (from the past 24 hour(s)). Imaging Studies:  No results found.  Assessment: Emily Wilkins is  28 y.o. G2P0010 at [redacted]w[redacted]d presents with decreased fetal movement, resolved.  Pelvic pressure, no suspicion for PTL.  Marland Kitchen  Plan: Pt needs to leave, FFN not back yet.  If +, WILL REPEAT in >24 hours d/t recent intercourse.  IF negative, results still valid. DC home  Christin Fudge 2/27/202011:27 PM

## 2018-10-28 NOTE — Discharge Instructions (Signed)

## 2018-10-29 LAB — FETAL FIBRONECTIN: Fetal Fibronectin: NEGATIVE

## 2018-11-10 ENCOUNTER — Encounter: Payer: Medicaid Other | Admitting: Obstetrics & Gynecology

## 2018-11-15 ENCOUNTER — Ambulatory Visit (INDEPENDENT_AMBULATORY_CARE_PROVIDER_SITE_OTHER): Payer: Medicaid Other | Admitting: Obstetrics and Gynecology

## 2018-11-15 ENCOUNTER — Other Ambulatory Visit (HOSPITAL_COMMUNITY)
Admission: RE | Admit: 2018-11-15 | Discharge: 2018-11-15 | Disposition: A | Discharge status: Home / Self Care | Payer: Medicaid Other | Source: Ambulatory Visit | Attending: Obstetrics and Gynecology | Admitting: Obstetrics and Gynecology

## 2018-11-15 ENCOUNTER — Other Ambulatory Visit: Payer: Self-pay

## 2018-11-15 ENCOUNTER — Encounter: Payer: Self-pay | Admitting: Obstetrics and Gynecology

## 2018-11-15 VITALS — BP 110/74 | HR 89 | Wt 150.4 lb

## 2018-11-15 DIAGNOSIS — Z3480 Encounter for supervision of other normal pregnancy, unspecified trimester: Secondary | ICD-10-CM | POA: Insufficient documentation

## 2018-11-15 DIAGNOSIS — O99019 Anemia complicating pregnancy, unspecified trimester: Secondary | ICD-10-CM

## 2018-11-15 DIAGNOSIS — O99343 Other mental disorders complicating pregnancy, third trimester: Secondary | ICD-10-CM

## 2018-11-15 DIAGNOSIS — F32A Depression, unspecified: Secondary | ICD-10-CM

## 2018-11-15 DIAGNOSIS — F329 Major depressive disorder, single episode, unspecified: Secondary | ICD-10-CM

## 2018-11-15 DIAGNOSIS — Z3A34 34 weeks gestation of pregnancy: Secondary | ICD-10-CM

## 2018-11-15 DIAGNOSIS — O99013 Anemia complicating pregnancy, third trimester: Secondary | ICD-10-CM

## 2018-11-15 DIAGNOSIS — D509 Iron deficiency anemia, unspecified: Secondary | ICD-10-CM

## 2018-11-15 NOTE — Progress Notes (Signed)
ROB.  C/o yellow discharge.  Denies odor, itching, burning, fever, chills, NV.

## 2018-11-15 NOTE — Progress Notes (Signed)
   PRENATAL VISIT NOTE  Subjective:  Emily Wilkins is a 28 y.o. G2P0010 at [redacted]w[redacted]d being seen today for ongoing prenatal care.  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  Patient reports presence of odorless, non pruritic yellow discharge.  Contractions: Not present. Vag. Bleeding: None.  Movement: Present. Denies leaking of fluid.   The following portions of the patient's history were reviewed and updated as appropriate: allergies, current medications, past family history, past medical history, past social history, past surgical history and problem list.   Objective:   Vitals:   11/15/18 1019  BP: 110/74  Pulse: 89  Weight: 150 lb 6.4 oz (68.2 kg)    Fetal Status: Fetal Heart Rate (bpm): 137 Fundal Height: 34 cm Movement: Present     General:  Alert, oriented and cooperative. Patient is in no acute distress.  Skin: Skin is warm and dry. No rash noted.   Cardiovascular: Normal heart rate noted  Respiratory: Normal respiratory effort, no problems with respiration noted  Abdomen: Soft, gravid, appropriate for gestational age.  Pain/Pressure: Present     Pelvic: Cervical exam deferred        Extremities: Normal range of motion.  Edema: Trace  Mental Status: Normal mood and affect. Normal behavior. Normal judgment and thought content.   Assessment and Plan:  Pregnancy: G2P0010 at [redacted]w[redacted]d 1. Supervision of other normal pregnancy, antepartum Patient is doing well - wet prep with cultures collected - GBS next visit - Cervicovaginal ancillary only( [place])  2. Depression affecting pregnancy in third trimester, antepartum Patient reports feeling much better Meeting with Roselyn Reef and anti depressants are helping her  3. Iron deficiency anemia during  pregnancy, antepartum Continue iron supplements Repeat CBC today - CBC  Preterm labor symptoms and general obstetric precautions including but not limited to vaginal bleeding, contractions, leaking of fluid and fetal movement were reviewed in detail with the patient. Please refer to After Visit Summary for other counseling recommendations.   No follow-ups on file.  No future appointments.  Mora Bellman, MD

## 2018-11-16 LAB — CBC
Hematocrit: 28.9 % — ABNORMAL LOW (ref 34.0–46.6)
Hemoglobin: 8.4 g/dL — ABNORMAL LOW (ref 11.1–15.9)
MCH: 18.8 pg — AB (ref 26.6–33.0)
MCHC: 29.1 g/dL — ABNORMAL LOW (ref 31.5–35.7)
MCV: 65 fL — ABNORMAL LOW (ref 79–97)
Platelets: 190 10*3/uL (ref 150–450)
RBC: 4.47 x10E6/uL (ref 3.77–5.28)
RDW: 17.3 % — ABNORMAL HIGH (ref 11.7–15.4)
WBC: 7.9 10*3/uL (ref 3.4–10.8)

## 2018-11-16 LAB — CERVICOVAGINAL ANCILLARY ONLY
Bacterial vaginitis: NEGATIVE
Candida vaginitis: POSITIVE — AB
Chlamydia: NEGATIVE
NEISSERIA GONORRHEA: NEGATIVE
Trichomonas: NEGATIVE

## 2018-11-17 MED ORDER — SODIUM CHLORIDE 0.9 % IV SOLN: 510.0000 mg | INTRAVENOUS | Status: AC

## 2018-11-17 MED ORDER — FLUCONAZOLE 150 MG PO TABS: 150.0000 mg | ORAL_TABLET | Freq: Once | ORAL | 0 refills | Status: AC

## 2018-11-17 NOTE — Addendum Note (Signed)
Addended by: Mora Bellman on: 11/17/2018 01:23 PM   Modules accepted: Orders, SmartSet

## 2018-11-18 ENCOUNTER — Telehealth: Payer: Self-pay

## 2018-11-18 NOTE — Telephone Encounter (Signed)
S/w pt and advised of results and need for iron infusion. Advised that scheduler will call.

## 2018-11-18 NOTE — Telephone Encounter (Signed)
Attempted to contact about results and need for iron infusion, no answer, left vm

## 2018-11-19 ENCOUNTER — Other Ambulatory Visit: Payer: Self-pay | Admitting: Obstetrics and Gynecology

## 2018-11-24 ENCOUNTER — Other Ambulatory Visit: Payer: Self-pay

## 2018-11-24 ENCOUNTER — Ambulatory Visit (HOSPITAL_COMMUNITY)
Admission: RE | Admit: 2018-11-24 | Discharge: 2018-11-24 | Disposition: A | Discharge status: Home / Self Care | Payer: Medicaid Other | Source: Ambulatory Visit | Attending: Obstetrics and Gynecology | Admitting: Obstetrics and Gynecology

## 2018-11-24 DIAGNOSIS — O99013 Anemia complicating pregnancy, third trimester: Secondary | ICD-10-CM | POA: Insufficient documentation

## 2018-11-24 MED ORDER — SODIUM CHLORIDE 0.9 % IV SOLN
510.0000 mg | INTRAVENOUS | Status: DC
  Administered 2018-11-24: 510 mg via INTRAVENOUS
  Filled 2018-11-24: qty 510

## 2018-11-29 ENCOUNTER — Ambulatory Visit (INDEPENDENT_AMBULATORY_CARE_PROVIDER_SITE_OTHER): Payer: Medicaid Other | Admitting: Obstetrics and Gynecology

## 2018-11-29 ENCOUNTER — Encounter: Payer: Self-pay | Admitting: Obstetrics and Gynecology

## 2018-11-29 ENCOUNTER — Other Ambulatory Visit (HOSPITAL_COMMUNITY)
Admission: RE | Admit: 2018-11-29 | Discharge: 2018-11-29 | Disposition: A | Discharge status: Home / Self Care | Payer: Medicaid Other | Source: Ambulatory Visit | Attending: Obstetrics and Gynecology | Admitting: Obstetrics and Gynecology

## 2018-11-29 ENCOUNTER — Other Ambulatory Visit: Payer: Self-pay

## 2018-11-29 VITALS — BP 106/66 | HR 95 | Wt 152.0 lb

## 2018-11-29 DIAGNOSIS — Z3480 Encounter for supervision of other normal pregnancy, unspecified trimester: Secondary | ICD-10-CM

## 2018-11-29 DIAGNOSIS — O99013 Anemia complicating pregnancy, third trimester: Secondary | ICD-10-CM

## 2018-11-29 DIAGNOSIS — F329 Major depressive disorder, single episode, unspecified: Secondary | ICD-10-CM

## 2018-11-29 DIAGNOSIS — D509 Iron deficiency anemia, unspecified: Secondary | ICD-10-CM

## 2018-11-29 DIAGNOSIS — O99019 Anemia complicating pregnancy, unspecified trimester: Secondary | ICD-10-CM

## 2018-11-29 DIAGNOSIS — O99343 Other mental disorders complicating pregnancy, third trimester: Secondary | ICD-10-CM

## 2018-11-29 NOTE — Progress Notes (Signed)
ROB/GBS.  C/o pulsating, shooting back pain 4-5/10 x 1.5 week.

## 2018-11-29 NOTE — Progress Notes (Signed)
   PRENATAL VISIT NOTE  Subjective:  Emily Wilkins is a 28 y.o. G2P0010 at [redacted]w[redacted]d being seen today for ongoing prenatal care.  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  Patient reports backache.  Contractions: Not present. Vag. Bleeding: None.  Movement: Present. Denies leaking of fluid.   The following portions of the patient's history were reviewed and updated as appropriate: allergies, current medications, past family history, past medical history, past social history, past surgical history and problem list.   Objective:   Vitals:   11/29/18 1331  BP: 106/66  Pulse: 95  Weight: 152 lb (68.9 kg)    Fetal Status: Fetal Heart Rate (bpm): 130 Fundal Height: 36 cm Movement: Present  Presentation: Vertex  General:  Alert, oriented and cooperative. Patient is in no acute distress.  Skin: Skin is warm and dry. No rash noted.   Cardiovascular: Normal heart rate noted  Respiratory: Normal respiratory effort, no problems with respiration noted  Abdomen: Soft, gravid, appropriate for gestational age.  Pain/Pressure: Present     Pelvic: Cervical exam performed Dilation: Fingertip Effacement (%): Thick Station: Ballotable  Extremities: Normal range of motion.  Edema: Trace  Mental Status: Normal mood and affect. Normal behavior. Normal judgment and thought content.   Assessment and Plan:  Pregnancy: G2P0010 at [redacted]w[redacted]d 1. Supervision of other normal pregnancy, antepartum Patient is doing well without complaints Cultures today BP cuff provided Patient understands that her next visit will be a virtual visit  2. Depression affecting pregnancy in third trimester, antepartum Stable on meds  3. Iron deficiency anemia during pregnancy, antepartum Feraheme on 4/1   Preterm labor symptoms and general obstetric precautions including but not limited to vaginal bleeding, contractions, leaking of fluid and fetal movement were reviewed in detail with the patient. Please refer to After Visit Summary for other counseling recommendations.   No follow-ups on file.  Future Appointments  Date Time Provider [place]  12/01/2018  1:00 PM [facility] ROOM 8 [facility] None    Mora Bellman, MD

## 2018-11-30 ENCOUNTER — Other Ambulatory Visit: Payer: Self-pay

## 2018-11-30 LAB — CERVICOVAGINAL ANCILLARY ONLY
CHLAMYDIA, DNA PROBE: NEGATIVE
Neisseria Gonorrhea: NEGATIVE

## 2018-12-01 ENCOUNTER — Ambulatory Visit (HOSPITAL_COMMUNITY)
Admission: RE | Admit: 2018-12-01 | Discharge: 2018-12-01 | Disposition: A | Discharge status: Home / Self Care | Payer: Medicaid Other | Source: Ambulatory Visit | Attending: Obstetrics and Gynecology | Admitting: Obstetrics and Gynecology

## 2018-12-01 ENCOUNTER — Other Ambulatory Visit: Payer: Self-pay

## 2018-12-01 DIAGNOSIS — F329 Major depressive disorder, single episode, unspecified: Secondary | ICD-10-CM | POA: Insufficient documentation

## 2018-12-01 DIAGNOSIS — O99343 Other mental disorders complicating pregnancy, third trimester: Secondary | ICD-10-CM | POA: Diagnosis not present

## 2018-12-01 DIAGNOSIS — Z3A36 36 weeks gestation of pregnancy: Secondary | ICD-10-CM | POA: Insufficient documentation

## 2018-12-01 DIAGNOSIS — O99013 Anemia complicating pregnancy, third trimester: Secondary | ICD-10-CM | POA: Insufficient documentation

## 2018-12-01 DIAGNOSIS — D509 Iron deficiency anemia, unspecified: Secondary | ICD-10-CM | POA: Insufficient documentation

## 2018-12-01 LAB — STREP GP B NAA: Strep Gp B NAA: NEGATIVE

## 2018-12-01 MED ORDER — SODIUM CHLORIDE 0.9 % IV SOLN
510.0000 mg | INTRAVENOUS | Status: DC
  Administered 2018-12-01: 510 mg via INTRAVENOUS
  Filled 2018-12-01: qty 510

## 2018-12-06 ENCOUNTER — Other Ambulatory Visit: Payer: Self-pay

## 2018-12-06 ENCOUNTER — Ambulatory Visit (INDEPENDENT_AMBULATORY_CARE_PROVIDER_SITE_OTHER): Payer: Medicaid Other | Admitting: Advanced Practice Midwife

## 2018-12-06 ENCOUNTER — Encounter: Payer: Self-pay | Admitting: Advanced Practice Midwife

## 2018-12-06 DIAGNOSIS — Z3480 Encounter for supervision of other normal pregnancy, unspecified trimester: Secondary | ICD-10-CM

## 2018-12-06 DIAGNOSIS — Z3A37 37 weeks gestation of pregnancy: Secondary | ICD-10-CM

## 2018-12-06 DIAGNOSIS — Z3483 Encounter for supervision of other normal pregnancy, third trimester: Secondary | ICD-10-CM

## 2018-12-06 NOTE — Progress Notes (Addendum)
   TELEHEALTH VIRTUAL OBSTETRICS VISIT ENCOUNTER NOTE  I connected with Emily Wilkins on 12/06/18 at  3:15 PM EDT by telephone at home and verified that I am speaking with the correct person using two identifiers.   I discussed the limitations, risks, security and privacy concerns of performing an evaluation and management service by telephone and the availability of in person appointments. I also discussed with the patient that there may be a patient responsible charge related to this service. The patient expressed understanding and agreed to proceed.  Subjective:  Emily Wilkins is a 28 y.o. G2P0010 at [redacted]w[redacted]d being followed for ongoing prenatal care.  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  Patient reports passed mucus plug yesterday with single episode of mucus with string of dark blood, no bleeding since. Reports fetal movement. Denies any contractions, bleeding or leaking of fluid.   The following portions of the patient's history were reviewed and updated as appropriate: allergies, current medications, past family history, past medical history, past social history, past surgical history and problem list.   Objective:   General:  Alert, oriented and cooperative.   Mental Status: Normal mood and affect perceived. Normal judgment and thought content.  Rest of physical exam deferred due to type of encounter  Assessment and Plan:  Pregnancy: G2P0010 at [redacted]w[redacted]d 1. Supervision of other normal pregnancy, antepartum --Anticipatory guidance about next visits/weeks of pregnancy given. --Pt reports good fetal movement, denies LOF, or frequent cramping --Reviewed safety, visitor policy, reassurance about COVID-19 for pregnancy at this time. Discussed possible changes  to visits, including televisits, that may occur due to COVID-19.  The office remains open if pt needs to be seen and MAU is open 24 hours/day for OB emergencies. --Questions answered about circumcision. Pt to schedule outpatient with [facility] after delivery.   Term labor symptoms and general obstetric precautions including but not limited to vaginal bleeding, contractions, leaking of fluid and fetal movement were reviewed in detail with the patient.  I discussed the assessment and treatment plan with the patient. The patient was provided an opportunity to ask questions and all were answered. The patient agreed with the plan and demonstrated an understanding of the instructions. The patient was advised to call back or seek an in-person office evaluation/go to MAU at [facility] for any urgent or concerning symptoms. Please refer to After Visit Summary for other counseling recommendations.   I provided 15 minutes of non-face-to-face time during this encounter.  Return in about 1 week (around 12/13/2018).  No future appointments.  Fatima Blank, [place] for [facility], [place]

## 2018-12-08 ENCOUNTER — Telehealth: Payer: Self-pay

## 2018-12-08 NOTE — Telephone Encounter (Signed)
S/w pt and reminded to log BP

## 2018-12-13 ENCOUNTER — Other Ambulatory Visit: Payer: Self-pay

## 2018-12-13 ENCOUNTER — Ambulatory Visit (INDEPENDENT_AMBULATORY_CARE_PROVIDER_SITE_OTHER): Payer: Medicaid Other | Admitting: Advanced Practice Midwife

## 2018-12-13 DIAGNOSIS — Z3483 Encounter for supervision of other normal pregnancy, third trimester: Secondary | ICD-10-CM

## 2018-12-13 DIAGNOSIS — Z3480 Encounter for supervision of other normal pregnancy, unspecified trimester: Secondary | ICD-10-CM

## 2018-12-13 DIAGNOSIS — Z3A38 38 weeks gestation of pregnancy: Secondary | ICD-10-CM

## 2018-12-13 NOTE — Progress Notes (Signed)
    TELEHEALTH VIRTUAL OBSTETRICS VISIT ENCOUNTER NOTE  I connected with Emily Wilkins on 12/13/18 at  4:00 PM EDT by telephone at home and verified that I am speaking with the correct person using two identifiers.   I discussed the limitations, risks, security and privacy concerns of performing an evaluation and management service by telephone and the availability of in person appointments. I also discussed with the patient that there may be a patient responsible charge related to this service. The patient expressed understanding and agreed to proceed.  Subjective:  Emily Wilkins is a 28 y.o. G2P0010 at [redacted]w[redacted]d being followed for ongoing prenatal care.  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  Patient reports occasional contractions. Reports fetal movement. Denies any contractions, bleeding or leaking of fluid.   The following portions of the patient's history were reviewed and updated as appropriate: allergies, current medications, past family history, past medical history, past social history, past surgical history and problem list.   Objective:   General:  Alert, oriented and cooperative.   Mental Status: Normal mood and affect perceived. Normal judgment and thought content.  Rest of physical exam deferred due to type of encounter  Assessment and Plan:  Pregnancy: G2P0010 at [redacted]w[redacted]d  1. Supervision of other normal pregnancy, antepartum --Anticipatory guidance about next visits/weeks of pregnancy given. --Reviewed safety, visitor policy, reassurance about COVID-19 for pregnancy at this time. Discussed possible changes to visits, including televisits, that may occur due to COVID-19.  The office remains open if pt needs to be seen and MAU is open 24 hours/day  for OB emergencies. --Discussed [place] for optimal fetal positioning at term.  Questions answered.  --Pt reports entering BP in Babyscripts but not viewable today in Epic.  BP on 12/10/18 was 97/76.  Pt denies s/sx of PEC. --Next visit in 1 week as televisit, BP weekly.   Term labor symptoms and general obstetric precautions including but not limited to vaginal bleeding, contractions, leaking of fluid and fetal movement were reviewed in detail with the patient.  I discussed the assessment and treatment plan with the patient. The patient was provided an opportunity to ask questions and all were answered. The patient agreed with the plan and demonstrated an understanding of the instructions. The patient was advised to call back or seek an in-person office evaluation/go to MAU at [facility] for any urgent or concerning symptoms. Please refer to After Visit Summary for other counseling recommendations.   I provided 10 minutes of non-face-to-face time during this encounter.  No follow-ups on file.  No future appointments.  Fatima Blank, [place] for [employer], [place]

## 2018-12-22 ENCOUNTER — Encounter (HOSPITAL_COMMUNITY): Payer: Self-pay | Admitting: *Deleted

## 2018-12-22 ENCOUNTER — Encounter: Payer: Self-pay | Admitting: Certified Nurse Midwife

## 2018-12-22 ENCOUNTER — Other Ambulatory Visit: Payer: Self-pay

## 2018-12-22 ENCOUNTER — Telehealth (HOSPITAL_COMMUNITY): Payer: Self-pay | Admitting: *Deleted

## 2018-12-22 ENCOUNTER — Ambulatory Visit (INDEPENDENT_AMBULATORY_CARE_PROVIDER_SITE_OTHER): Payer: Medicaid Other | Admitting: Certified Nurse Midwife

## 2018-12-22 DIAGNOSIS — O99343 Other mental disorders complicating pregnancy, third trimester: Secondary | ICD-10-CM

## 2018-12-22 DIAGNOSIS — D509 Iron deficiency anemia, unspecified: Secondary | ICD-10-CM

## 2018-12-22 DIAGNOSIS — Z3480 Encounter for supervision of other normal pregnancy, unspecified trimester: Secondary | ICD-10-CM

## 2018-12-22 DIAGNOSIS — O99019 Anemia complicating pregnancy, unspecified trimester: Secondary | ICD-10-CM

## 2018-12-22 DIAGNOSIS — O99013 Anemia complicating pregnancy, third trimester: Secondary | ICD-10-CM

## 2018-12-22 DIAGNOSIS — F329 Major depressive disorder, single episode, unspecified: Secondary | ICD-10-CM

## 2018-12-22 MED ORDER — SERTRALINE HCL 50 MG PO TABS: 50.0000 mg | ORAL_TABLET | Freq: Every day | ORAL | 3 refills | Status: DC

## 2018-12-22 NOTE — Telephone Encounter (Signed)
Preadmission screen  

## 2018-12-22 NOTE — Addendum Note (Signed)
Addended by: Lajean Manes on: 12/22/2018 11:21 AM   Modules accepted: Level of Service

## 2018-12-22 NOTE — Progress Notes (Signed)
   [name]  I connected with Emily Wilkins on 12/22/18 at  8:45 AM EDT by webex at home and verified that I am speaking with the correct person using two identifiers.   I discussed the limitations, risks, security and privacy concerns of performing an evaluation and management service by telephone and the availability of in person appointments. I also discussed with the patient that there may be a patient responsible charge related to this service. The patient expressed understanding and agreed to proceed.  Subjective:  Emily Wilkins is a 28 y.o. G2P0010 at [redacted]w[redacted]d being followed for ongoing prenatal care.  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  Patient reports occasional contractions and abnormal fetal movement. Reports fetal movement. Denies any contractions, bleeding or leaking of fluid.   The following portions of the patient's history were reviewed and updated as appropriate: allergies, current medications, past family history, past medical history, past social history, past surgical history and problem list.   Objective:   General:  Alert, oriented and cooperative.   Mental Status: Normal mood and affect perceived. Normal judgment and thought content.  Rest of physical exam deferred due to type of encounter  Assessment and Plan:  Pregnancy: G2P0010 at [redacted]w[redacted]d 1. Supervision of other normal pregnancy, antepartum - patient reports she is feeling fetal movement but not as normal as earlier in pregnancy, reports feeling 4-5 kicks when she woke up at 0530 then went back to sleep, has been up for the past 30 minutes and denies feeling movement.  - encouraged patient to eat ice or drink cold drink and count movement.  Reviewed fetal count and encouraged patient to be seen in MAU if she does not feel 10 movements in 2 hours. Patient verbalizes understanding  - Discussed antenatal screening next week due to post dates  - AFI and NST scheduled with MFM on Monday  - IOL scheduled for 12/31/18 @ 0700, orders placed   2. Iron deficiency anemia during pregnancy, antepartum - Continue supplementation   3. Depression affecting pregnancy in third trimester, antepartum - Patient reports doing well on Zoloft medication and dosing  - Refill sent to pharmacy on file   Term labor symptoms and general obstetric precautions including but not limited to vaginal bleeding, contractions, leaking of fluid and fetal movement were reviewed in detail with the patient.  I discussed the assessment and treatment plan with the patient. The patient was provided an opportunity to ask questions and all were answered. The patient agreed with the plan and demonstrated an understanding of the instructions. The patient was advised to call back or seek an in-person office evaluation/go to MAU at [facility] for any urgent or concerning symptoms. Please refer to After Visit Summary for other counseling recommendations.   I provided 10 minutes of non-face-to-face time during this encounter.  Return in about 5 days (around 12/27/2018) for ROB (webex).  Future Appointments  Date Time Provider [place]  12/27/2018 11:30 AM Elvera Maria, CNM [facility] None  12/27/2018  3:15 PM Lamar [place] 4 [facility]  12/31/2018  7:00 AM [facility] [place], [place] for [employer], [place]

## 2018-12-22 NOTE — Progress Notes (Signed)
Pt is on the phone preparing for Webex with provider. [redacted]w[redacted]d.

## 2018-12-24 ENCOUNTER — Other Ambulatory Visit: Payer: Self-pay | Admitting: Advanced Practice Midwife

## 2018-12-26 ENCOUNTER — Other Ambulatory Visit: Payer: Self-pay

## 2018-12-26 ENCOUNTER — Encounter (HOSPITAL_COMMUNITY): Payer: Self-pay

## 2018-12-26 ENCOUNTER — Inpatient Hospital Stay (EMERGENCY_DEPARTMENT_HOSPITAL)
Admission: AD | Admit: 2018-12-26 | Discharge: 2018-12-27 | Disposition: A | Discharge status: Home / Self Care | Payer: Medicaid Other | Source: Home / Self Care | Attending: Obstetrics & Gynecology | Admitting: Obstetrics & Gynecology

## 2018-12-26 DIAGNOSIS — Z3689 Encounter for other specified antenatal screening: Secondary | ICD-10-CM

## 2018-12-26 DIAGNOSIS — Z3A40 40 weeks gestation of pregnancy: Secondary | ICD-10-CM | POA: Insufficient documentation

## 2018-12-26 DIAGNOSIS — O471 False labor at or after 37 completed weeks of gestation: Secondary | ICD-10-CM

## 2018-12-26 DIAGNOSIS — O479 False labor, unspecified: Secondary | ICD-10-CM

## 2018-12-26 NOTE — MAU Note (Signed)
Contractions 6-12 minutes apart. Also had some brown discharge and bleeding around 1830.  No LOF.  + FM.  No complications w/ pregnancy.

## 2018-12-27 ENCOUNTER — Telehealth: Payer: Self-pay | Admitting: *Deleted

## 2018-12-27 ENCOUNTER — Inpatient Hospital Stay (HOSPITAL_COMMUNITY): Payer: Medicaid Other | Admitting: Anesthesiology

## 2018-12-27 ENCOUNTER — Ambulatory Visit (HOSPITAL_COMMUNITY): Payer: Medicaid Other

## 2018-12-27 ENCOUNTER — Encounter (HOSPITAL_COMMUNITY): Payer: Self-pay | Admitting: *Deleted

## 2018-12-27 ENCOUNTER — Inpatient Hospital Stay (HOSPITAL_COMMUNITY)
Admission: AD | Admit: 2018-12-27 | Discharge: 2018-12-29 | DRG: 807 | Disposition: A | Discharge status: Home / Self Care | Payer: Medicaid Other | Attending: Obstetrics and Gynecology | Admitting: Obstetrics and Gynecology

## 2018-12-27 ENCOUNTER — Encounter: Payer: Self-pay | Admitting: Advanced Practice Midwife

## 2018-12-27 ENCOUNTER — Other Ambulatory Visit: Payer: Self-pay

## 2018-12-27 DIAGNOSIS — F329 Major depressive disorder, single episode, unspecified: Secondary | ICD-10-CM | POA: Diagnosis present

## 2018-12-27 DIAGNOSIS — Z3A40 40 weeks gestation of pregnancy: Secondary | ICD-10-CM | POA: Diagnosis not present

## 2018-12-27 DIAGNOSIS — D509 Iron deficiency anemia, unspecified: Secondary | ICD-10-CM | POA: Diagnosis present

## 2018-12-27 DIAGNOSIS — O368190 Decreased fetal movements, unspecified trimester, not applicable or unspecified: Secondary | ICD-10-CM | POA: Diagnosis present

## 2018-12-27 DIAGNOSIS — O480 Post-term pregnancy: Secondary | ICD-10-CM | POA: Diagnosis present

## 2018-12-27 DIAGNOSIS — O368130 Decreased fetal movements, third trimester, not applicable or unspecified: Secondary | ICD-10-CM | POA: Diagnosis present

## 2018-12-27 DIAGNOSIS — Z87891 Personal history of nicotine dependence: Secondary | ICD-10-CM

## 2018-12-27 DIAGNOSIS — O99344 Other mental disorders complicating childbirth: Secondary | ICD-10-CM | POA: Diagnosis present

## 2018-12-27 DIAGNOSIS — O99019 Anemia complicating pregnancy, unspecified trimester: Secondary | ICD-10-CM

## 2018-12-27 DIAGNOSIS — F32A Depression, unspecified: Secondary | ICD-10-CM | POA: Diagnosis present

## 2018-12-27 DIAGNOSIS — O9902 Anemia complicating childbirth: Secondary | ICD-10-CM | POA: Diagnosis present

## 2018-12-27 DIAGNOSIS — Z3480 Encounter for supervision of other normal pregnancy, unspecified trimester: Secondary | ICD-10-CM

## 2018-12-27 DIAGNOSIS — O99343 Other mental disorders complicating pregnancy, third trimester: Secondary | ICD-10-CM

## 2018-12-27 LAB — TYPE AND SCREEN
ABO/RH(D): O POS
Antibody Screen: NEGATIVE

## 2018-12-27 LAB — CBC
HCT: 39.5 % (ref 36.0–46.0)
Hemoglobin: 12.3 g/dL (ref 12.0–15.0)
MCH: 24.5 pg — ABNORMAL LOW (ref 26.0–34.0)
MCHC: 31.1 g/dL (ref 30.0–36.0)
MCV: 78.5 fL — ABNORMAL LOW (ref 80.0–100.0)
Platelets: 218 10*3/uL (ref 150–400)
RBC: 5.03 MIL/uL (ref 3.87–5.11)
WBC: 12.5 10*3/uL — ABNORMAL HIGH (ref 4.0–10.5)
nRBC: 0 % (ref 0.0–0.2)

## 2018-12-27 MED ORDER — NALBUPHINE HCL 10 MG/ML IJ SOLN: 10.0000 mg | Freq: Once | INTRAMUSCULAR | Status: DC

## 2018-12-27 MED ORDER — OXYCODONE-ACETAMINOPHEN 5-325 MG PO TABS: 1.0000 | ORAL_TABLET | ORAL | Status: DC | PRN

## 2018-12-27 MED ORDER — FENTANYL-BUPIVACAINE-NACL 0.5-0.125-0.9 MG/250ML-% EP SOLN: 12.0000 mL/h | EPIDURAL | Status: DC | PRN

## 2018-12-27 MED ORDER — SOD CITRATE-CITRIC ACID 500-334 MG/5ML PO SOLN: 30.0000 mL | ORAL | Status: DC | PRN

## 2018-12-27 MED ORDER — LIDOCAINE HCL (PF) 1 % IJ SOLN: 30.0000 mL | INTRAMUSCULAR | Status: DC | PRN

## 2018-12-27 MED ORDER — TERBUTALINE SULFATE 1 MG/ML IJ SOLN: 0.2500 mg | Freq: Once | INTRAMUSCULAR | Status: DC | PRN

## 2018-12-27 MED ORDER — OXYCODONE-ACETAMINOPHEN 5-325 MG PO TABS: 2.0000 | ORAL_TABLET | ORAL | Status: DC | PRN

## 2018-12-27 MED ORDER — ACETAMINOPHEN 325 MG PO TABS: 650.0000 mg | ORAL_TABLET | ORAL | Status: DC | PRN

## 2018-12-27 MED ORDER — EPHEDRINE 5 MG/ML INJ: 10.0000 mg | INTRAVENOUS | Status: DC | PRN

## 2018-12-27 MED ORDER — LACTATED RINGERS IV SOLN
INTRAVENOUS | Status: DC
  Administered 2018-12-27 (×3): via INTRAVENOUS

## 2018-12-27 MED ORDER — OXYTOCIN 40 UNITS IN NORMAL SALINE INFUSION - SIMPLE MED
2.5000 [IU]/h | INTRAVENOUS | Status: DC
  Filled 2018-12-27: qty 1000

## 2018-12-27 MED ORDER — PHENYLEPHRINE 40 MCG/ML (10ML) SYRINGE FOR IV PUSH (FOR BLOOD PRESSURE SUPPORT)
PREFILLED_SYRINGE | INTRAVENOUS | Status: AC
  Filled 2018-12-27: qty 10

## 2018-12-27 MED ORDER — LACTATED RINGERS IV SOLN: 500.0000 mL | INTRAVENOUS | Status: DC | PRN

## 2018-12-27 MED ORDER — PHENYLEPHRINE 40 MCG/ML (10ML) SYRINGE FOR IV PUSH (FOR BLOOD PRESSURE SUPPORT): 80.0000 ug | PREFILLED_SYRINGE | INTRAVENOUS | Status: DC | PRN

## 2018-12-27 MED ORDER — OXYTOCIN 40 UNITS IN NORMAL SALINE INFUSION - SIMPLE MED: 1.0000 m[IU]/min | INTRAVENOUS | Status: DC

## 2018-12-27 MED ORDER — DIPHENHYDRAMINE HCL 50 MG/ML IJ SOLN: 12.5000 mg | INTRAMUSCULAR | Status: DC | PRN

## 2018-12-27 MED ORDER — LIDOCAINE HCL (PF) 1 % IJ SOLN
INTRAMUSCULAR | Status: DC | PRN
  Administered 2018-12-27: 6 mL via EPIDURAL

## 2018-12-27 MED ORDER — NALBUPHINE HCL 10 MG/ML IJ SOLN
10.0000 mg | Freq: Once | INTRAMUSCULAR | Status: AC
  Administered 2018-12-27: 10 mg via INTRAMUSCULAR
  Filled 2018-12-27: qty 1

## 2018-12-27 MED ORDER — FENTANYL-BUPIVACAINE-NACL 0.5-0.125-0.9 MG/250ML-% EP SOLN
EPIDURAL | Status: AC
  Filled 2018-12-27: qty 250

## 2018-12-27 MED ORDER — LACTATED RINGERS IV SOLN: 500.0000 mL | Freq: Once | INTRAVENOUS | Status: DC

## 2018-12-27 MED ORDER — SODIUM CHLORIDE (PF) 0.9 % IJ SOLN
INTRAMUSCULAR | Status: DC | PRN
  Administered 2018-12-27: 14 mL/h via EPIDURAL

## 2018-12-27 MED ORDER — ONDANSETRON HCL 4 MG/2ML IJ SOLN
4.0000 mg | Freq: Four times a day (QID) | INTRAMUSCULAR | Status: DC | PRN
  Administered 2018-12-27: 21:00:00 4 mg via INTRAVENOUS
  Filled 2018-12-27: qty 2

## 2018-12-27 MED ORDER — OXYTOCIN BOLUS FROM INFUSION
500.0000 mL | Freq: Once | INTRAVENOUS | Status: AC
  Administered 2018-12-28: 500 mL via INTRAVENOUS

## 2018-12-27 NOTE — MAU Note (Signed)
Pt seen in MAU during the night, [place] home.  Contractions have been every 4-5 minutes for the last 1 1/2 hours, having bloody show, denies LOF.  Decreased fetal movement since early morning.

## 2018-12-27 NOTE — Anesthesia Preprocedure Evaluation (Signed)
Anesthesia Evaluation  Patient identified by MRN, date of birth, ID band Patient awake    Reviewed: Allergy & Precautions, H&P , NPO status , Patient's Chart, lab work & pertinent test results, reviewed documented beta blocker date and time   Airway Mallampati: II  TM Distance: >3 FB Neck ROM: full    Dental no notable dental hx.    Pulmonary neg pulmonary ROS, former smoker,    Pulmonary exam normal breath sounds clear to auscultation       Cardiovascular negative cardio ROS Normal cardiovascular exam Rhythm:regular Rate:Normal     Neuro/Psych PSYCHIATRIC DISORDERS Anxiety Depression negative neurological ROS     GI/Hepatic negative GI ROS, Neg liver ROS,   Endo/Other  negative endocrine ROS  Renal/GU negative Renal ROS  negative genitourinary   Musculoskeletal   Abdominal   Peds  Hematology negative hematology ROS (+) Blood dyscrasia, anemia ,   Anesthesia Other Findings   Reproductive/Obstetrics (+) Pregnancy                             Anesthesia Physical Anesthesia Plan  ASA: II  Anesthesia Plan: Epidural   Post-op Pain Management:    Induction:   PONV Risk Score and Plan:   Airway Management Planned:   Additional Equipment:   Intra-op Plan:   Post-operative Plan:   Informed Consent: I have reviewed the patients History and Physical, chart, labs and discussed the procedure including the risks, benefits and alternatives for the proposed anesthesia with the patient or authorized representative who has indicated his/her understanding and acceptance.     Dental Advisory Given  Plan Discussed with: Anesthesiologist and CRNA  Anesthesia Plan Comments: (Labs checked- platelets confirmed with RN in room. Fetal heart tracing, per RN, reported to be stable enough for sitting procedure. Discussed epidural, and patient consents to the procedure:  included risk of possible  headache,backache, failed block, allergic reaction, and nerve injury. This patient was asked if she had any questions or concerns before the procedure started.)        Anesthesia Quick Evaluation

## 2018-12-27 NOTE — H&P (Signed)
OBSTETRIC ADMISSION HISTORY AND PHYSICAL  Emily Wilkins is a 28 y.o. female G2P0010 with IUP at [redacted]w[redacted]d by L/19 presenting for early labor, decreased fetal movement. Also reports some bloody show, no leakage of fluids.   She received her prenatal care at [facility].  Support person in labor: FOB  Ultrasounds . 19w3: EFW 38%, anterior placenta, normal anatomy U/S, some missing facial views  . 23w4: EFW 54%, completion of fetal anatomy U/S wnl  Prenatal History/Complications: . Depression - Zoloft . Iron deficiency anemia - feraheme x 2 received in pregnancy   Past Medical History: Past Medical History:  Diagnosis Date  . Allergy   . Anxiety     Past Surgical History: Past Surgical History:  Procedure Laterality Date  . tonsilect    . TONSILLECTOMY      Obstetrical History: OB History    Gravida  2   Para  0   Term  0   Preterm  0   AB  1   Living  0     SAB  0   TAB  0   Ectopic  0   Multiple  0   Live Births  0           Social History: Social History   Socioeconomic History  . Marital status: Married    Spouse name: Not on file  . Number of children: Not on file  . Years of education: Not on file  . Highest education level: Not on file  Occupational History  . Not on file  Social Needs  . Financial resource strain: Not hard at all  . Food insecurity:    Worry: Never true    Inability: Never true  . Transportation needs:    Medical: No    Non-medical: Not on file  Tobacco Use  . Smoking status: Former Smoker    Types: Pipe  . Smokeless tobacco: Never Used  Substance and Sexual Activity  . Alcohol use: No  . Drug use: No  . Sexual activity: Yes    Partners: Male    Birth control/protection: None  Lifestyle  . Physical activity:    Days per week: Not on file    Minutes per session: Not on file  . Stress: Not at all  Relationships  . Social connections:    Talks on phone: Not on file    Gets together: Not on file    Attends  religious service: Not on file    Active member of club or organization: Not on file    Attends meetings of clubs or organizations: Not on file    Relationship status: Not on file  Other Topics Concern  . Not on file  Social History Narrative  . Not on file    Family History: Family History  Problem Relation Age of Onset  . ALS Father     Allergies: No Known Allergies  Medications Prior to Admission  Medication Sig Dispense Refill Last Dose  . Prenatal Vit-Fe Fumarate-FA (PRENATAL MULTIVITAMIN) TABS tablet Take 1 tablet by mouth daily at 12 noon.   Past Month at Unknown time  . sertraline (ZOLOFT) 50 MG tablet Take 1 tablet (50 mg total) by mouth daily. 30 tablet 3 12/26/2018 at Unknown time  . ferrous sulfate (FERROUSUL) 325 (65 FE) MG tablet Take 1 tablet (325 mg total) by mouth daily with breakfast. (Patient not taking: Reported on 12/06/2018) 60 tablet 1 Not Taking     Review of Systems  All  systems reviewed and negative except as stated in HPI  Blood pressure 116/82, pulse 89, temperature 98.3 F (36.8 C), temperature source Oral, resp. rate 18, height 5\' 3"  (1.6 m), weight 73.1 kg, last menstrual period 03/19/2018. General appearance: well-appearing, NAD Lungs: no respiratory distress Heart: regular rate  Abdomen: soft, non-tender; gravid  Pelvic: deferred Extremities: no significant LE edema  Presentation: cephalic by prior check with sutures  Fetal monitoring: 110-120s/mod/+a/-d Uterine activity: every 3-5 minutes Dilation: 4.5 Effacement (%): 60 Station: -2 Exam by:: Emily Wilkins CNM  Prenatal labs: ABO, Rh: --/--/PENDING (04/27 1645) Antibody: PENDING (04/27 1645) Rubella: 3.91 (10/03 1048) RPR: Non Reactive (02/05 1100)  HBsAg: Negative (10/03 1048)  HIV: Non Reactive (02/05 1100)  GBS: Negative (03/30 0220)  Glucola: normal 2-hr GTT Genetic screening:  Low risk NIPS  screen negative AFP  Prenatal Transfer Tool  Maternal Diabetes: No Genetic Screening:  Normal Maternal Ultrasounds/Referrals: Normal Fetal Ultrasounds or other Referrals:  None Maternal Substance Abuse:  No Significant Maternal Medications:  Zoloft, PNV, feraheme Significant Maternal Lab Results: None  Results for orders placed or performed during the hospital encounter of 12/27/18 (from the past 24 hour(s))  Type and screen   Collection Time: 12/27/18  4:45 PM  Result Value Ref Range   ABO/RH(D) PENDING    Antibody Screen PENDING    Sample Expiration      12/30/2018 Performed at [facility], [address], [place], [place] [address]     Patient Active Problem List   Diagnosis Date Noted  . Post-dates pregnancy 12/27/2018  . Decreased fetal movement 12/27/2018  . Depression affecting pregnancy in third trimester, antepartum 10/14/2018  . Iron deficiency anemia during pregnancy, antepartum 06/10/2018  . Supervision of other normal pregnancy, antepartum 06/03/2018    Assessment/Plan:  Emily Wilkins is a 28 y.o. G2P0010 at [redacted]w[redacted]d here for early labor and decreased fetal movement at term.   Labor: Early labor, will expectantly manage at this time since contractions are increasing and recent membrane sweep. Would consider augmentation with pitocin or AROM prn.  -- pain control: desires epidural  Fetal Wellbeing: EFW 7lbs by Leopold's. Cephalic by sutures on prior RN/CNM check) -- continuous fetal monitoring - category I   Postpartum Planning -- breast/patch -- RI/[x] Tdap/[x] flu   Emily Wilkins S. Juleen China, DO OB/GYN Fellow

## 2018-12-27 NOTE — Anesthesia Procedure Notes (Signed)
Epidural Patient location during procedure: OB Start time: 12/27/2018 6:06 PM End time: 12/27/2018 6:10 PM  Preanesthetic Checklist Completed: patient identified, site marked, surgical consent, pre-op evaluation, timeout performed, IV checked, risks and benefits discussed and monitors and equipment checked  Epidural Patient position: sitting Prep: site prepped and draped and DuraPrep Patient monitoring: continuous pulse ox and blood pressure Approach: midline Location: L3-L4 Injection technique: LOR air  Needle:  Needle type: Tuohy  Needle gauge: 17 G Needle length: 9 cm and 9 Needle insertion depth: 5 cm Catheter type: closed end flexible Catheter size: 19 Gauge Catheter at skin depth: 10 cm Test dose: negative  Assessment Events: blood not aspirated, injection not painful, no injection resistance, negative IV test and no paresthesia

## 2018-12-27 NOTE — Telephone Encounter (Signed)
Pt called to office stating that her ctx are closer together and wants to know what she should do.  Return call to pt.  Pt states that her ctx have gone from every 8-10 min to now every 4-5 for the past 45 minutes.  Pt states increase in pelvic pressure. Pt advised that she may be seen at [facility] for labor eval.  Pt states understanding.

## 2018-12-27 NOTE — MAU Provider Note (Signed)
None    S: Ms. Emily Wilkins is a 28 y.o. G2P0010 at [redacted]w[redacted]d  who presents to MAU today complaining of contractions with irregular pattern.  She denies vaginal bleeding. She denies LOF. She reports normal fetal movement.    O: BP 114/75   Pulse 69   Temp 98 F (36.7 C) (Oral)   Resp 16   Wt 74.1 kg   LMP 03/19/2018   SpO2 100%   BMI 28.93 kg/m  GENERAL: Well-developed, well-nourished female in no acute distress.  HEAD: Normocephalic, atraumatic.  CHEST: Normal effort of breathing, regular heart rate ABDOMEN: Soft, nontender, gravid  Cervical exam:  Dilation: 1 Effacement (%): Thick Station: Ballotable Presentation: Undeterminable Exam by:: Denyse Dago, RN   Fetal Monitoring: Baseline: 135 bpm Variability: Moderate  Accelerations: 15x15 Decelerations: at 2330 early deceleration noted; down to 110 bpm last 30 secs. Return to baseline with moderate variability. Patient on the fetal monitor for an additional 45 minutes without further decelerations.  Contractions: Occasional    A: SIUP at [redacted]w[redacted]d  False labor  Reactive NST.  P: Discharge home with strict return precautions Labor precautions Fetal kick counts.   Lezlie Lye, NP 12/27/2018 12:38 AM

## 2018-12-28 ENCOUNTER — Encounter (HOSPITAL_COMMUNITY): Payer: Self-pay | Admitting: Obstetrics and Gynecology

## 2018-12-28 DIAGNOSIS — O480 Post-term pregnancy: Secondary | ICD-10-CM

## 2018-12-28 DIAGNOSIS — Z3A40 40 weeks gestation of pregnancy: Secondary | ICD-10-CM

## 2018-12-28 LAB — RPR: RPR Ser Ql: NONREACTIVE

## 2018-12-28 LAB — ABO/RH: ABO/RH(D): O POS

## 2018-12-28 MED ORDER — SIMETHICONE 80 MG PO CHEW: 80.0000 mg | CHEWABLE_TABLET | ORAL | Status: DC | PRN

## 2018-12-28 MED ORDER — ONDANSETRON HCL 4 MG PO TABS: 4.0000 mg | ORAL_TABLET | ORAL | Status: DC | PRN

## 2018-12-28 MED ORDER — MEASLES, MUMPS & RUBELLA VAC IJ SOLR: 0.5000 mL | Freq: Once | INTRAMUSCULAR | Status: DC

## 2018-12-28 MED ORDER — WITCH HAZEL-GLYCERIN EX PADS: 1.0000 "application " | MEDICATED_PAD | CUTANEOUS | Status: DC | PRN

## 2018-12-28 MED ORDER — COCONUT OIL OIL: 1.0000 "application " | TOPICAL_OIL | Status: DC | PRN

## 2018-12-28 MED ORDER — BENZOCAINE-MENTHOL 20-0.5 % EX AERO
1.0000 "application " | INHALATION_SPRAY | CUTANEOUS | Status: DC | PRN
  Administered 2018-12-28: 1 via TOPICAL
  Filled 2018-12-28: qty 56

## 2018-12-28 MED ORDER — PRENATAL MULTIVITAMIN CH
1.0000 | ORAL_TABLET | Freq: Every day | ORAL | Status: DC
  Administered 2018-12-28 – 2018-12-29 (×2): 1 via ORAL
  Filled 2018-12-28 (×2): qty 1

## 2018-12-28 MED ORDER — OXYCODONE-ACETAMINOPHEN 5-325 MG PO TABS: 1.0000 | ORAL_TABLET | ORAL | Status: DC | PRN

## 2018-12-28 MED ORDER — MAGNESIUM HYDROXIDE 400 MG/5ML PO SUSP: 30.0000 mL | ORAL | Status: DC | PRN

## 2018-12-28 MED ORDER — IBUPROFEN 600 MG PO TABS
600.0000 mg | ORAL_TABLET | Freq: Four times a day (QID) | ORAL | Status: DC
  Administered 2018-12-28 – 2018-12-29 (×5): 600 mg via ORAL
  Filled 2018-12-28 (×5): qty 1

## 2018-12-28 MED ORDER — DIBUCAINE (PERIANAL) 1 % EX OINT: 1.0000 "application " | TOPICAL_OINTMENT | CUTANEOUS | Status: DC | PRN

## 2018-12-28 MED ORDER — FERROUS SULFATE 325 (65 FE) MG PO TABS
325.0000 mg | ORAL_TABLET | Freq: Two times a day (BID) | ORAL | Status: DC
  Administered 2018-12-28 – 2018-12-29 (×2): 325 mg via ORAL
  Filled 2018-12-28 (×2): qty 1

## 2018-12-28 MED ORDER — ONDANSETRON HCL 4 MG/2ML IJ SOLN: 4.0000 mg | INTRAMUSCULAR | Status: DC | PRN

## 2018-12-28 MED ORDER — ERYTHROMYCIN 5 MG/GM OP OINT
TOPICAL_OINTMENT | OPHTHALMIC | Status: AC
  Filled 2018-12-28: qty 1

## 2018-12-28 MED ORDER — DIPHENHYDRAMINE HCL 25 MG PO CAPS: 25.0000 mg | ORAL_CAPSULE | Freq: Four times a day (QID) | ORAL | Status: DC | PRN

## 2018-12-28 MED ORDER — ACETAMINOPHEN 325 MG PO TABS: 650.0000 mg | ORAL_TABLET | ORAL | Status: DC | PRN

## 2018-12-28 MED ORDER — TETANUS-DIPHTH-ACELL PERTUSSIS 5-2.5-18.5 LF-MCG/0.5 IM SUSP: 0.5000 mL | Freq: Once | INTRAMUSCULAR | Status: DC

## 2018-12-28 MED ORDER — OXYCODONE-ACETAMINOPHEN 5-325 MG PO TABS: 2.0000 | ORAL_TABLET | ORAL | Status: DC | PRN

## 2018-12-28 NOTE — Lactation Note (Signed)
This note was copied from a baby's chart. Lactation Consultation Note  Patient Name: Emily Wilkins [id number] Date: 12/28/2018 [facility] consult at 2 pm / late entry   baby is 51 hours old  Per mom baby has been spitty and lasyt attempted to feed at 1300 ,  Its 1400 and LC noted the baby to be spitty x 3 and LC used the bulb syringe x 3.  Reviewed with parents burping and using the bulb syringe. Made the MBU secretary aware  The baby is spitty and if the parents call the baby should be checked on quickly.  LC showed mom how to hand express with 33ml and spoon fed the baby / baby tolerated well.  [place] laid baby on moms chest STS and placed a light blanket on top.  LC reassured mom and dad once baby stops being spitty will eat better, in the mean time hand expressing  And spoon feed as shown.   Per mom would like to sign up with [place] [facility]. LC mentioned to mom she will have to call them.  LC reviewed the [place] breast feeding resources after d/C.     Maternal Data    Feeding Feeding Type: Breast Fed  LATCH Score                   Interventions    Lactation Tools Discussed/Used     Consult Status      Emily Wilkins 12/28/2018, 7:02 PM

## 2018-12-28 NOTE — Anesthesia Postprocedure Evaluation (Signed)
Anesthesia Post Note  Patient: Emily Wilkins  Procedure(s) Performed: AN AD HOC LABOR EPIDURAL     Patient location during evaluation: Mother Baby Anesthesia Type: Epidural Level of consciousness: awake Pain management: pain level controlled Vital Signs Assessment: post-procedure vital signs reviewed and stable Respiratory status: spontaneous breathing Cardiovascular status: stable Postop Assessment: no headache, no backache, patient able to bend at knees and able to ambulate Anesthetic complications: no Comments: Phone postop    Last Vitals:  Vitals:   12/28/18 0840 12/28/18 0956  BP: 112/68 110/63  Pulse: 83 78  Resp: 20 18  Temp: 36.9 C 37.1 C  SpO2:      Last Pain:  Vitals:   12/28/18 1125  TempSrc:   PainSc: 5    Pain Goal: Patients Stated Pain Goal: 0 (12/27/18 1327)                 Everette Rank

## 2018-12-28 NOTE — Discharge Summary (Signed)
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 yo G2P1011 at [redacted]w[redacted]d was admitted in Active Labor on 12/27/2018. Patient had an uncomplicated labor course as follows:  Membrane Rupture Time/Date: 12:32 AM ,12/28/2018   Intrapartum Procedures: Episiotomy: None [1]                                         Lacerations:  2nd degree [3];Perineal [11]  Patient had a delivery of a Viable infant. 12/28/2018  Information for the patient's newborn:  Timmia, [place] [[id number]]  Delivery Method: Vaginal, Spontaneous(Filed from Delivery Summary)    Pateint had an uncomplicated postpartum course.  She is ambulating, tolerating a regular diet, passing flatus, and urinating well. Patient is discharged home in stable condition on 12/29/18.   Magnesium Sulfate recieved: No BMZ received: No  Physical exam  Vitals:   12/28/18 1340 12/28/18 1738 12/28/18 2155 12/29/18 0526  BP: (!) 110/59 (!) 93/58 (!) 105/57 93/62  Pulse: 74 80  73  Resp: 18 18 18 16   Temp: 98.8 F (37.1 C) 98.4 F (36.9 C) 98.7 F (37.1 C) 97.9 F (36.6 C)  TempSrc: Oral Oral Oral Oral  SpO2:  100% 99% 99%  Weight:       Height:       General: alert, cooperative and no distress Lochia: appropriate Uterine Fundus: firm Incision: N/A DVT Evaluation: No evidence of DVT seen on physical exam. Labs: Lab Results  Component Value Date   WBC 12.5 (H) 12/27/2018   HGB 12.3 12/27/2018   HCT 39.5 12/27/2018   MCV 78.5 (L) 12/27/2018   PLT 218 12/27/2018   CMP Latest Ref Rng & Units 02/15/2016  Glucose 65 - 99 mg/dL 90  BUN 6 - 20 mg/dL 9  Creatinine 0.44 - 1.00 mg/dL 0.47  Sodium 135 - 145 mmol/L 138  Potassium 3.5 - 5.1 mmol/L 3.5  Chloride 101 - 111 mmol/L 107  CO2 22 - 32 mmol/L 23  Calcium 8.9 - 10.3 mg/dL 9.0  Total Protein 6.5 - 8.1 g/dL 7.2  Total Bilirubin 0.3 - 1.2 mg/dL 1.0  Alkaline Phos 38 - 126 U/L 48  AST 15 - 41  U/L 17  ALT 14 - 54 U/L 13(L)    Discharge instruction: per After Visit Summary and "Baby and Me Booklet".  After visit meds:  Allergies as of 12/29/2018   No Known Allergies     Medication List    STOP taking these medications   calcium carbonate 500 MG chewable tablet Commonly known as:  TUMS - dosed in mg elemental calcium     TAKE these medications   ferrous sulfate 325 (65 FE) MG tablet Commonly known as:  FerrouSul Take 1 tablet (325 mg total) by mouth daily with breakfast.   ibuprofen 600 MG tablet Commonly known as:  ADVIL Take 1 tablet (600 mg total) by mouth every 6 (six) hours.   loratadine 10 MG tablet Commonly known as:  CLARITIN Take 10 mg by mouth daily as needed for allergies.   prenatal multivitamin Tabs tablet Take 1 tablet by mouth daily at 12 noon.   sertraline 50 MG tablet Commonly known as:  Zoloft Take 1 tablet (50 mg total) by mouth daily.       Diet: routine diet  Activity: Advance as tolerated. Pelvic rest for 6 weeks.   Outpatient follow up:6 weeks Follow up Appt: Future Appointments  Date Time Provider [place]  01/10/2019  1:30 PM Leftwich-Kirby, Kathie Dike, CNM [facility] None  01/26/2019  1:15 PM Leftwich-Kirby,  Kathie Dike, CNM [place] None   Follow up Visit: [place]. Schedule an appointment as soon as possible for a visit in 4 week(s).   Specialty:  Obstetrics and Gynecology Contact information: [address] [place] [phone]         Please schedule this patient for Postpartum visit in: 6 weeks with the following provider: Any provider For C/S patients schedule nurse incision check in weeks 2 weeks: no Low risk pregnancy complicated by: hx of depression, anemia Delivery mode:  SVD Anticipated Birth Control:  depo inpatient > patch later PP Procedures needed: mood check (1-2 weeks) Schedule Integrated BH visit: yes  Newborn Data: Live born female  Birth Weight: 7 lb 3.7 oz (3280 g) APGAR: 9, 9  Newborn Delivery   Birth date/time:  12/28/2018 05:16:00 Delivery type:  Vaginal, Spontaneous     Baby Feeding: Breast Disposition:home with mother after circumcision   12/29/2018 Hansel Feinstein, CNM

## 2018-12-29 MED ORDER — SUCROSE 24% NICU/PEDS ORAL SOLUTION: 0.5000 mL | OROMUCOSAL | Status: DC | PRN

## 2018-12-29 MED ORDER — WHITE PETROLATUM EX OINT: 1.0000 "application " | TOPICAL_OINTMENT | CUTANEOUS | Status: DC | PRN

## 2018-12-29 MED ORDER — ACETAMINOPHEN FOR CIRCUMCISION 160 MG/5 ML
40.0000 mg | ORAL | Status: DC | PRN
  Filled 2018-12-29: qty 1.25

## 2018-12-29 MED ORDER — LIDOCAINE 1% INJECTION FOR CIRCUMCISION
0.8000 mL | INJECTION | Freq: Once | INTRAVENOUS | Status: DC
  Filled 2018-12-29: qty 1

## 2018-12-29 MED ORDER — ACETAMINOPHEN FOR CIRCUMCISION 160 MG/5 ML
40.0000 mg | Freq: Once | ORAL | Status: DC
  Filled 2018-12-29: qty 1.25

## 2018-12-29 MED ORDER — EPINEPHRINE TOPICAL FOR CIRCUMCISION 0.1 MG/ML
1.0000 [drp] | TOPICAL | Status: DC | PRN
  Filled 2018-12-29: qty 1

## 2018-12-29 MED ORDER — IBUPROFEN 600 MG PO TABS: 600.0000 mg | ORAL_TABLET | Freq: Four times a day (QID) | ORAL | 0 refills | Status: DC

## 2018-12-29 NOTE — Discharge Instructions (Signed)
Vaginal Delivery, Care After °Refer to this sheet in the next few weeks. These instructions provide you with information about caring for yourself after vaginal delivery. Your health care provider may also give you more specific instructions. Your treatment has been planned according to current medical practices, but problems sometimes occur. Call your health care provider if you have any problems or questions. °What can I expect after the procedure? °After vaginal delivery, it is common to have: °· Some bleeding from your vagina. °· Soreness in your abdomen, your vagina, and the area of skin between your vaginal opening and your anus (perineum). °· Pelvic cramps. °· Fatigue. °Follow these instructions at home: °Medicines °· Take over-the-counter and prescription medicines only as told by your health care provider. °· If you were prescribed an antibiotic medicine, take it as told by your health care provider. Do not stop taking the antibiotic until it is finished. °Driving ° °· Do not drive or operate heavy machinery while taking prescription pain medicine. °· Do not drive for 24 hours if you received a sedative. °Lifestyle °· Do not drink alcohol. This is especially important if you are breastfeeding or taking medicine to relieve pain. °· Do not use tobacco products, including cigarettes, chewing tobacco, or e-cigarettes. If you need help quitting, ask your health care provider. °Eating and drinking °· Drink at least 8 eight-ounce glasses of water every day unless you are told not to by your health care provider. If you choose to breastfeed your baby, you may need to drink more water than this. °· Eat high-fiber foods every day. These foods may help prevent or relieve constipation. High-fiber foods include: °? Whole grain cereals and breads. °? Brown rice. °? Beans. °? Fresh fruits and vegetables. °Activity °· Return to your normal activities as told by your health care provider. Ask your health care provider what  activities are safe for you. °· Rest as much as possible. Try to rest or take a nap when your baby is sleeping. °· Do not lift anything that is heavier than your baby or 10 lb (4.5 kg) until your health care provider says that it is safe. °· Talk with your health care provider about when you can engage in sexual activity. This may depend on your: °? Risk of infection. °? Rate of healing. °? Comfort and desire to engage in sexual activity. °Vaginal Care °· If you have an episiotomy or a vaginal tear, check the area every day for signs of infection. Check for: °? More redness, swelling, or pain. °? More fluid or blood. °? Warmth. °? Pus or a bad smell. °· Do not use tampons or douches until your health care provider says this is safe. °· Watch for any blood clots that may pass from your vagina. These may look like clumps of dark red, brown, or black discharge. °General instructions °· Keep your perineum clean and dry as told by your health care provider. °· Wear loose, comfortable clothing. °· Wipe from front to back when you use the toilet. °· Ask your health care provider if you can shower or take a bath. If you had an episiotomy or a perineal tear during labor and delivery, your health care provider may tell you not to take baths for a certain length of time. °· Wear a bra that supports your breasts and fits you well. °· If possible, have someone help you with household activities and help care for your baby for at least a few days after you   leave the hospital. °· Keep all follow-up visits for you and your baby as told by your health care provider. This is important. °Contact a health care provider if: °· You have: °? Vaginal discharge that has a bad smell. °? Difficulty urinating. °? Pain when urinating. °? A sudden increase or decrease in the frequency of your bowel movements. °? More redness, swelling, or pain around your episiotomy or vaginal tear. °? More fluid or blood coming from your episiotomy or vaginal  tear. °? Pus or a bad smell coming from your episiotomy or vaginal tear. °? A fever. °? A rash. °? Little or no interest in activities you used to enjoy. °? Questions about caring for yourself or your baby. °· Your episiotomy or vaginal tear feels warm to the touch. °· Your episiotomy or vaginal tear is separating or does not appear to be healing. °· Your breasts are painful, hard, or turn red. °· You feel unusually sad or worried. °· You feel nauseous or you vomit. °· You pass large blood clots from your vagina. If you pass a blood clot from your vagina, save it to show to your health care provider. Do not flush blood clots down the toilet without having your health care provider look at them. °· You urinate more than usual. °· You are dizzy or light-headed. °· You have not breastfed at all and you have not had a menstrual period for 12 weeks after delivery. °· You have stopped breastfeeding and you have not had a menstrual period for 12 weeks after you stopped breastfeeding. °Get help right away if: °· You have: °? Pain that does not go away or does not get better with medicine. °? Chest pain. °? Difficulty breathing. °? Blurred vision or spots in your vision. °? Thoughts about hurting yourself or your baby. °· You develop pain in your abdomen or in one of your legs. °· You develop a severe headache. °· You faint. °· You bleed from your vagina so much that you fill two sanitary pads in one hour. °This information is not intended to replace advice given to you by your health care provider. Make sure you discuss any questions you have with your health care provider. °Document Released: 08/15/2000 Document Revised: 01/30/2016 Document Reviewed: 09/02/2015 °Elsevier Interactive Patient Education © 2019 Elsevier Inc. ° °

## 2018-12-29 NOTE — Progress Notes (Signed)
[name] was referred for history of depression/anxiety. * Referral screened out by Clinical Social Worker because none of the following criteria appear to apply: ~ History of anxiety/depression during this pregnancy, or of post-partum depression following prior delivery. ~ Diagnosis of anxiety and/or depression within last 3 years OR * [name]'s symptoms currently being treated with medication and/or therapy. Per [facility] records, [name] on Zoloft for depression/anxiety. [name] has also been meeting with therapist per chart review.  Please contact the Clinical Social Worker if needs arise, by [facility] request, or if [name] scores greater than 9/yes to question 10 on Edinburgh Postpartum Depression Screen.   Emily Wilkins, MSW, LCSW-A [facility] [place] [phone]

## 2018-12-29 NOTE — Procedures (Deleted)
SUBJECTIVE 1-day old female presents for elective circumcision.  ROS:  No fever  OBJECTIVE: Vitals: reviewed GU: normal female anatomy, bilateral testes descended, no evidence of Epi- or hypospadias.   Procedure: Newborn Female Circumcision using a Gomco Indication: Parental request EBL: Minimal Complications: None immediate Anesthesia: 1% lidocaine local  Procedure in detail:  Written consent was obtained after the risks and benefits of the procedure were discussed. A dorsal penile nerve block was performed with 1% lidocaine.  The area was then cleaned with betadine and draped in sterile fashion.  Two hemostats were applied at the 2 o'clock and 10 o'clock positions on the foreskin.  While maintaining traction, a third hemostat was used to sweep around the glans to the release adhesions between the glans and the inner layer of mucosa avoiding the 5 o'clock and 7 o'clock positions.   The hemostat was then placed at the 12 o'clock position in the midline for hemstasis.  The hemostat was then removed and scissors were used to cut along the crushed skin to its most proximal point.   The foreskin was retracted over the glans removing any additional adhesions with blunt dissection or probe as needed.  The foreskin was then placed back over the glans and the  1.1  gomco bell was inserted over the glans.  The two hemostats were removed and one hemostat held the foreskin and underlying mucosa.  The incision was guided above the base plate of the gomco.  The clamp was then attached and tightened until the foreskin was crushed between the bell and the base plate.  A scalpel was then used to cut the foreskin above the base plate. The thumbscrew was then loosened, base plate removed and then bell removed with gentle traction.  The area was inspected and found to be hemostatic.    Lambert Mody. Juleen China, DO OB/GYN Fellow 12/29/2018 10:55 AM

## 2018-12-29 NOTE — Lactation Note (Signed)
This note was copied from a baby's chart. Lactation Consultation Note  Patient Name: Emily Wilkins Date: 12/29/2018  P1, 5 hours female infant. Infant had 7 stools and 4 voids since delivery. Per mom, she finished breastfeeding 30 minutes prior to [name] entering the room. Mom feels breastfeeding is going well infant has been latching well since he is no longer spitty. He started latching without difficulty after 7:30 pm last night.  Mom will continue to breastfeed according to hunger cues, 8 or more times within 24 hours. Mom will call Nurse or [place] if she has any questions, concerns or need assistance with latching infant to breast.    Maternal Data    Feeding Feeding Type: Breast Fed  LATCH Score                   Interventions    Lactation Tools Discussed/Used     Consult Status      Vicente Serene 12/29/2018, 12:52 AM

## 2018-12-31 ENCOUNTER — Inpatient Hospital Stay (HOSPITAL_COMMUNITY): Payer: Medicaid Other

## 2019-01-10 ENCOUNTER — Ambulatory Visit: Payer: Medicaid Other | Admitting: Advanced Practice Midwife

## 2019-01-12 ENCOUNTER — Encounter: Payer: Self-pay | Admitting: Obstetrics & Gynecology

## 2019-01-12 ENCOUNTER — Ambulatory Visit (INDEPENDENT_AMBULATORY_CARE_PROVIDER_SITE_OTHER): Payer: Medicaid Other | Admitting: Obstetrics & Gynecology

## 2019-01-12 ENCOUNTER — Other Ambulatory Visit: Payer: Self-pay

## 2019-01-12 DIAGNOSIS — F329 Major depressive disorder, single episode, unspecified: Secondary | ICD-10-CM

## 2019-01-12 DIAGNOSIS — O99343 Other mental disorders complicating pregnancy, third trimester: Secondary | ICD-10-CM

## 2019-01-12 MED ORDER — SERTRALINE HCL 100 MG PO TABS: 100.0000 mg | ORAL_TABLET | Freq: Every day | ORAL | 1 refills | Status: DC

## 2019-01-12 NOTE — Progress Notes (Signed)
Presented for Depression Screening.   Score=5.  She currently on Zoloft.

## 2019-01-12 NOTE — Patient Instructions (Signed)

## 2019-01-12 NOTE — Progress Notes (Signed)
Subjective:     Patient ID: Emily Wilkins, female   DOB: October 11, 1990, 28 y.o.   MRN: [id number] Cc: Anxious [id number] 2 weeks post partum SVD. She is on Zoloft for depression and her score is low. Anxiety is worse and constant since delivery. Current Outpatient Medications on File Prior to Visit  Medication Sig Dispense Refill  . ferrous sulfate (FERROUSUL) 325 (65 FE) MG tablet Take 1 tablet (325 mg total) by mouth daily with breakfast. 60 tablet 1  . ibuprofen (ADVIL) 600 MG tablet Take 1 tablet (600 mg total) by mouth every 6 (six) hours. 30 tablet 0  . loratadine (CLARITIN) 10 MG tablet Take 10 mg by mouth daily as needed for allergies.    . Prenatal Vit-Fe Fumarate-FA (PRENATAL MULTIVITAMIN) TABS tablet Take 1 tablet by mouth daily at 12 noon.     No current facility-administered medications on file prior to visit.   No Known Allergies Social History   Socioeconomic History  . Marital status: Married    Spouse name: Not on file  . Number of children: Not on file  . Years of education: Not on file  . Highest education level: Not on file  Occupational History  . Not on file  Social Needs  . Financial resource strain: Not hard at all  . Food insecurity:    Worry: Never true    Inability: Never true  . Transportation needs:    Medical: No    Non-medical: Not on file  Tobacco Use  . Smoking status: Former Smoker    Types: Pipe  . Smokeless tobacco: Never Used  Substance and Sexual Activity  . Alcohol use: No  . Drug use: No  . Sexual activity: Yes    Partners: Male    Birth control/protection: None  Lifestyle  . Physical activity:    Days per week: Not on file    Minutes per session: Not on file  . Stress: Not at all  Relationships  . Social connections:    Talks on phone: Not on file    Gets together: Not on file    Attends religious service: Not on file    Active member of club or organization: Not on file    Attends meetings of clubs or organizations: Not on  file    Relationship status: Not on file  . Intimate partner violence:    Fear of current or ex partner: No    Emotionally abused: No    Physically abused: No    Forced sexual activity: No  Other Topics Concern  . Not on file  Social History Narrative  . Not on file      Review of Systems  Respiratory: Negative.   Gastrointestinal: Negative.   Genitourinary: Negative.   Psychiatric/Behavioral: Negative for dysphoric mood. The patient is nervous/anxious.        Objective:   Physical Exam Constitutional:      Appearance: Normal appearance.  Neurological:     Mental Status: She is alert.  Psychiatric:        Mood and Affect: Mood normal.        Behavior: Behavior normal.         Assessment:     History of depression and anxiety without good sx control on present medication 2 week postpartum    Plan:     Increase Zoloft to 100 mg daily RTC 2 weeks and consider f/u with Vesta Mixer if needed  Woodroe Mode, MD 01/12/2019

## 2019-01-26 ENCOUNTER — Encounter: Payer: Self-pay | Admitting: Advanced Practice Midwife

## 2019-01-26 ENCOUNTER — Other Ambulatory Visit: Payer: Self-pay

## 2019-01-26 ENCOUNTER — Ambulatory Visit (INDEPENDENT_AMBULATORY_CARE_PROVIDER_SITE_OTHER): Payer: Medicaid Other | Admitting: Advanced Practice Midwife

## 2019-01-26 DIAGNOSIS — Z1389 Encounter for screening for other disorder: Secondary | ICD-10-CM

## 2019-01-26 DIAGNOSIS — Z3009 Encounter for other general counseling and advice on contraception: Secondary | ICD-10-CM

## 2019-01-26 DIAGNOSIS — O99343 Other mental disorders complicating pregnancy, third trimester: Secondary | ICD-10-CM

## 2019-01-26 DIAGNOSIS — F329 Major depressive disorder, single episode, unspecified: Secondary | ICD-10-CM

## 2019-01-26 NOTE — Progress Notes (Signed)
Post Partum Exam  Emily Wilkins is a 28 y.o. G3P1011 female who presents for a postpartum visit. She is 4 weeks postpartum following a spontaneous vaginal delivery. I have fully reviewed the prenatal and intrapartum course. The delivery was at 40 gestational weeks.  Anesthesia: epidural. Postpartum course has been we;; Baby's course has been well. Baby is feeding by breast. Bleeding no bleeding. Bowel function is normal. Bladder function is normal. Patient is not sexually active. Contraception method is none. Postpartum depression screening: EPDS :8  The following portions of the patient's history were reviewed and updated as appropriate: allergies, current medications, past family history, past medical history, past social history, past surgical history and problem list. Last pap smear done 04/15/2018 and was Normal  Review of Systems Pertinent items noted in HPI and remainder of comprehensive ROS otherwise negative.    Objective:  currently breastfeeding.  VS reviewed, nursing note reviewed,  Constitutional: well developed, well nourished, no distress HEENT: normocephalic CV: normal rate Pulm/chest wall: normal effort Abdomen: soft Neuro: alert and oriented x 3 Skin: warm, dry Psych: affect normal      Assessment/Plan:   1. Postpartum examination following vaginal delivery --Doing well. Bonding well with infant, breastfeeding.    2. Encounter for counseling regarding contraception Discussed LARCs as most effective forms of birth control.  Discussed benefits/risks of other methods.  Pt considering LARC but would like to talk with her husband.  Market researcher given.    3. Depression affecting pregnancy in third trimester, antepartum --Pt taking Zoloft 50 mg daily during pregnancy, increased to 100 mg daily 2 weeks ago.  Reports she is feeling well, no depressive feelings.  Reports larger dose of Zoloft is helping.  She declines behavioral health or counseling at this time.   Reviewed warning signs/reasons to seek care.     Social/emotional concerns:  none.  Contraception: abstinence  Follow up in: 1 year or as needed for contraception.   Fatima Blank, CNM 2:31 PM

## 2019-01-26 NOTE — Patient Instructions (Signed)
Contraception Choices  Contraception, also called birth control, refers to methods or devices that prevent pregnancy.  Hormonal methods  Contraceptive implant    A contraceptive implant is a thin, plastic tube that contains a hormone. It is inserted into the upper part of the arm. It can remain in place for up to 3 years.  Progestin-only injections  Progestin-only injections are injections of progestin, a synthetic form of the hormone progesterone. They are given every 3 months by a health care provider.  Birth control pills    Birth control pills are pills that contain hormones that prevent pregnancy. They must be taken once a day, preferably at the same time each day.  Birth control patch    The birth control patch contains hormones that prevent pregnancy. It is placed on the skin and must be changed once a week for three weeks and removed on the fourth week. A prescription is needed to use this method of contraception.  Vaginal ring    A vaginal ring contains hormones that prevent pregnancy. It is placed in the vagina for three weeks and removed on the fourth week. After that, the process is repeated with a new ring. A prescription is needed to use this method of contraception.  Emergency contraceptive  Emergency contraceptives prevent pregnancy after unprotected sex. They come in pill form and can be taken up to 5 days after sex. They work best the sooner they are taken after having sex. Most emergency contraceptives are available without a prescription. This method should not be used as your only form of birth control.  Barrier methods  Female condom    A female condom is a thin sheath that is worn over the penis during sex. Condoms keep sperm from going inside a woman's body. They can be used with a spermicide to increase their effectiveness. They should be disposed after a single use.  Female condom    A female condom is a soft, loose-fitting sheath that is put into the vagina before sex. The condom keeps sperm  from going inside a woman's body. They should be disposed after a single use.  Diaphragm    A diaphragm is a soft, dome-shaped barrier. It is inserted into the vagina before sex, along with a spermicide. The diaphragm blocks sperm from entering the uterus, and the spermicide kills sperm. A diaphragm should be left in the vagina for 6-8 hours after sex and removed within 24 hours.  A diaphragm is prescribed and fitted by a health care provider. A diaphragm should be replaced every 1-2 years, after giving birth, after gaining more than 15 lb (6.8 kg), and after pelvic surgery.  Cervical cap    A cervical cap is a round, soft latex or plastic cup that fits over the cervix. It is inserted into the vagina before sex, along with spermicide. It blocks sperm from entering the uterus. The cap should be left in place for 6-8 hours after sex and removed within 48 hours. A cervical cap must be prescribed and fitted by a health care provider. It should be replaced every 2 years.  Sponge    A sponge is a soft, circular piece of polyurethane foam with spermicide on it. The sponge helps block sperm from entering the uterus, and the spermicide kills sperm. To use it, you make it wet and then insert it into the vagina. It should be inserted before sex, left in for at least 6 hours after sex, and removed and thrown away within   30 hours.  Spermicides  Spermicides are chemicals that kill or block sperm from entering the cervix and uterus. They can come as a cream, jelly, suppository, foam, or tablet. A spermicide should be inserted into the vagina with an applicator at least 10-15 minutes before sex to allow time for it to work. The process must be repeated every time you have sex. Spermicides do not require a prescription.  Intrauterine contraception  Intrauterine device (IUD)  An IUD is a T-shaped device that is put in a woman's uterus. There are two types:   Hormone IUD.This type contains progestin, a synthetic form of the hormone  progesterone. This type can stay in place for 3-5 years.   Copper IUD.This type is wrapped in copper wire. It can stay in place for 10 years.    Permanent methods of contraception  Female tubal ligation  In this method, a woman's fallopian tubes are sealed, tied, or blocked during surgery to prevent eggs from traveling to the uterus.  Hysteroscopic sterilization  In this method, a small, flexible insert is placed into each fallopian tube. The inserts cause scar tissue to form in the fallopian tubes and block them, so sperm cannot reach an egg. The procedure takes about 3 months to be effective. Another form of birth control must be used during those 3 months.  Female sterilization  This is a procedure to tie off the tubes that carry sperm (vasectomy). After the procedure, the man can still ejaculate fluid (semen).  Natural planning methods  Natural family planning  In this method, a couple does not have sex on days when the woman could become pregnant.  Calendar method  This means keeping track of the length of each menstrual cycle, identifying the days when pregnancy can happen, and not having sex on those days.  Ovulation method  In this method, a couple avoids sex during ovulation.  Symptothermal method  This method involves not having sex during ovulation. The woman typically checks for ovulation by watching changes in her temperature and in the consistency of cervical mucus.  Post-ovulation method  In this method, a couple waits to have sex until after ovulation.  Summary   Contraception, also called birth control, means methods or devices that prevent pregnancy.   Hormonal methods of contraception include implants, injections, pills, patches, vaginal rings, and emergency contraceptives.   Barrier methods of contraception can include female condoms, female condoms, diaphragms, cervical caps, sponges, and spermicides.   There are two types of IUDs (intrauterine devices). An IUD can be put in a woman's uterus to  prevent pregnancy for 3-5 years.   Permanent sterilization can be done through a procedure for males, females, or both.   Natural family planning methods involve not having sex on days when the woman could become pregnant.  This information is not intended to replace advice given to you by your health care provider. Make sure you discuss any questions you have with your health care provider.  Document Released: 08/18/2005 Document Revised: 08/20/2017 Document Reviewed: 09/20/2016  Elsevier Interactive Patient Education  2019 [employer]

## 2019-03-27 IMAGING — US US MFM OB COMP +14 WKS
1 of 2 series · 13 of 28 positions shown · non-contrast
Comparison: none

[Series 1: us mfm ob comp +14 wks · 13 of 93 slices shown]
[im 4/93]
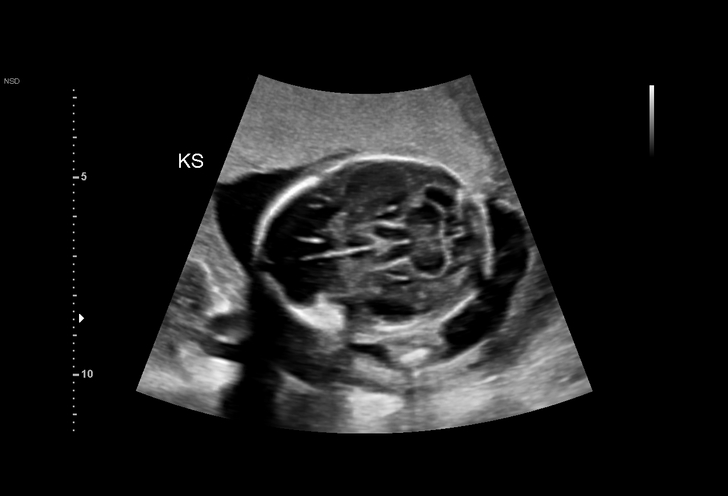
[im 11/93]
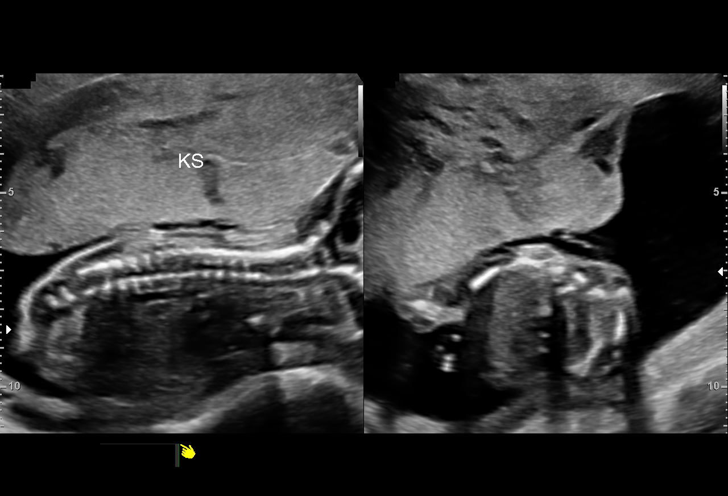
[im 18/93]
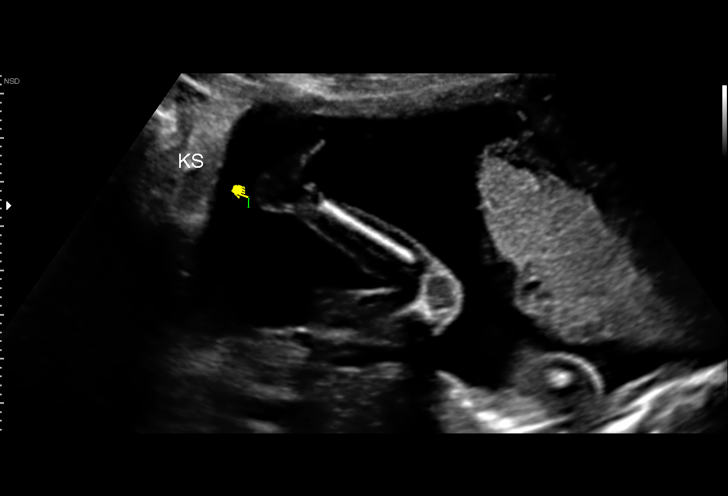
[im 25/93]
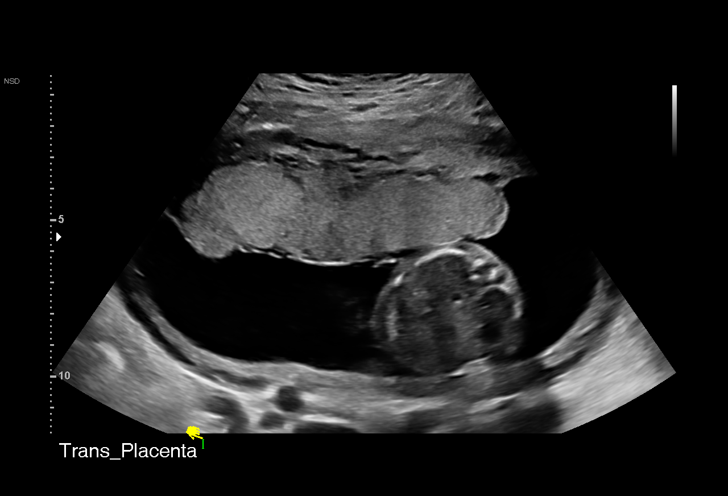
[im 32/93]
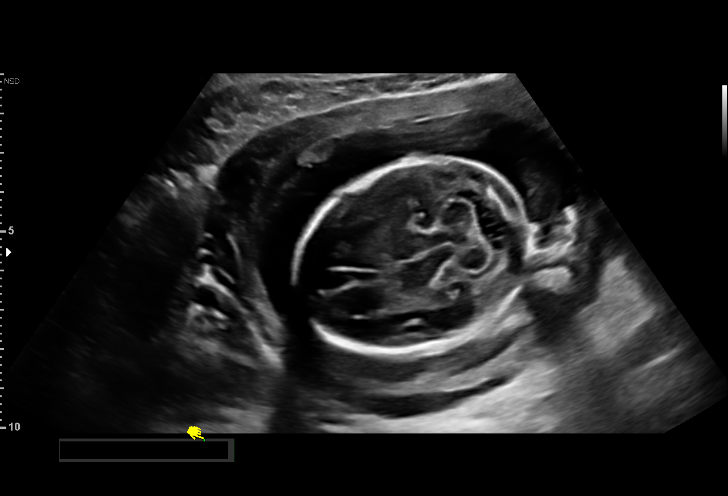
[im 39/93]
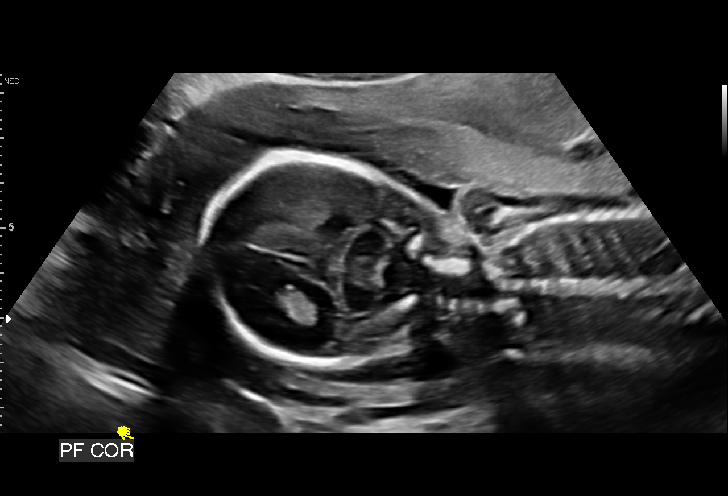
[im 50/93]
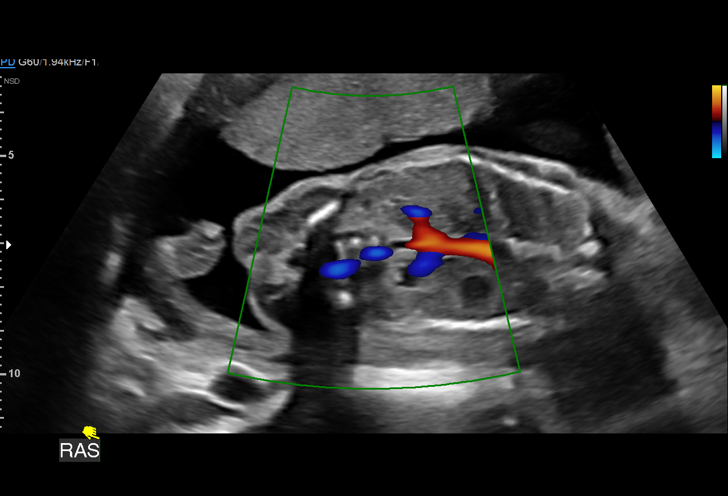
[im 57/93]
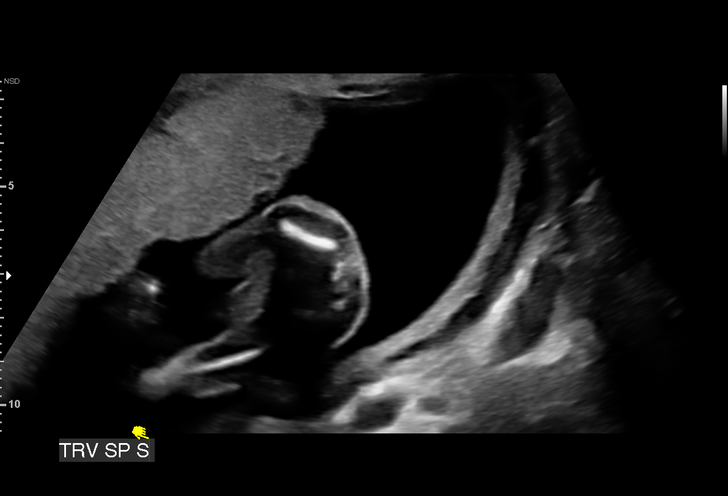
[im 64/93]
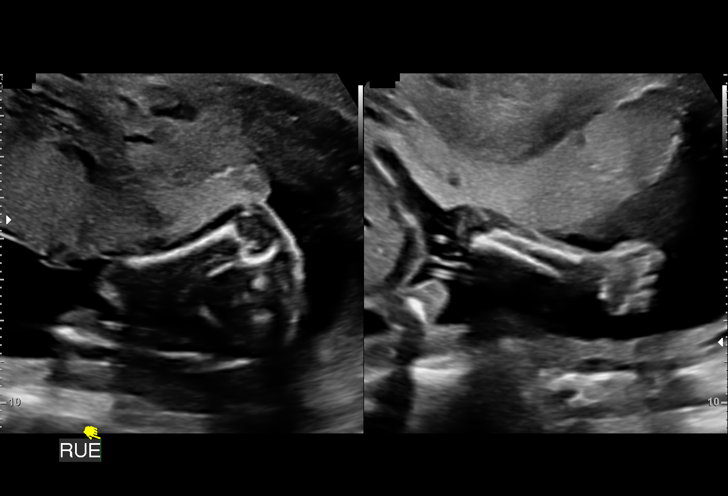
[im 71/93]
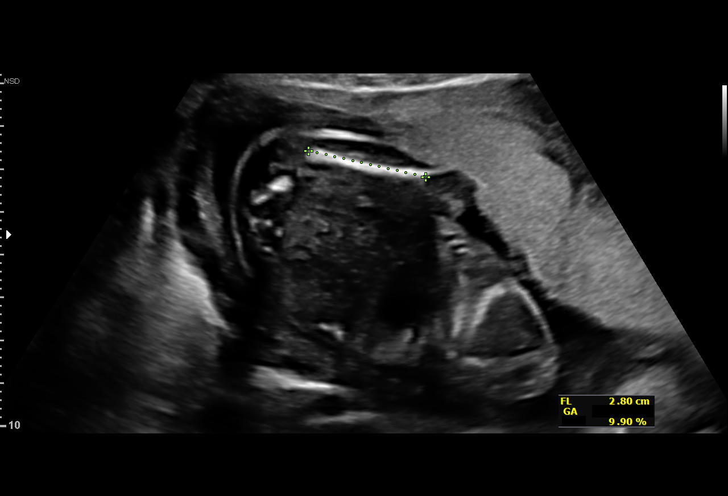
[im 78/93]
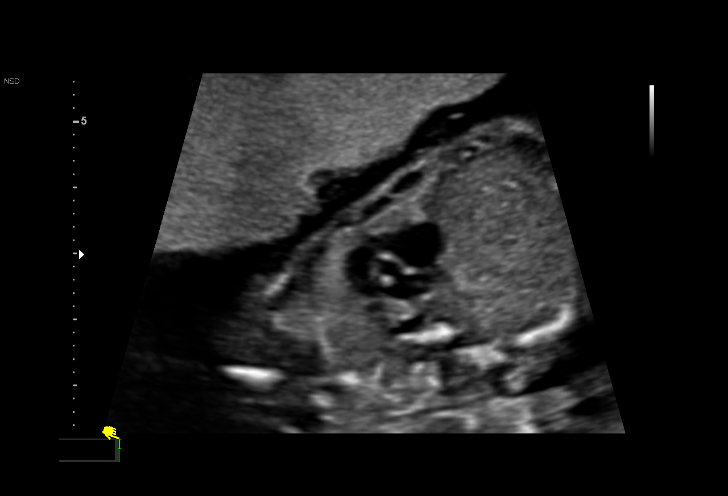
[im 85/93]
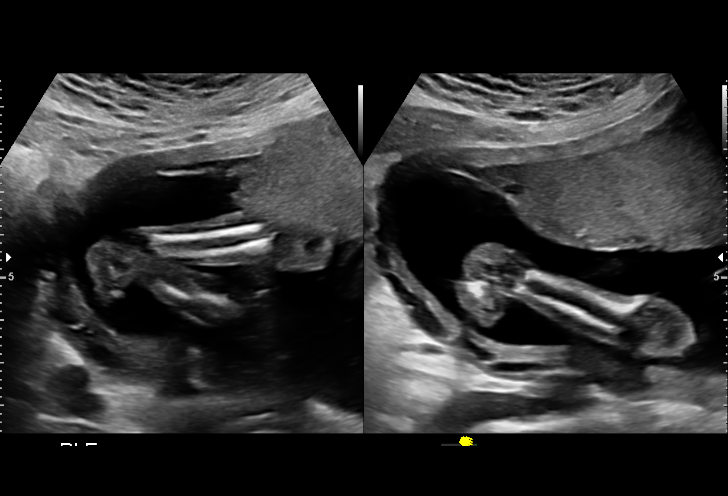
[im 93/93]
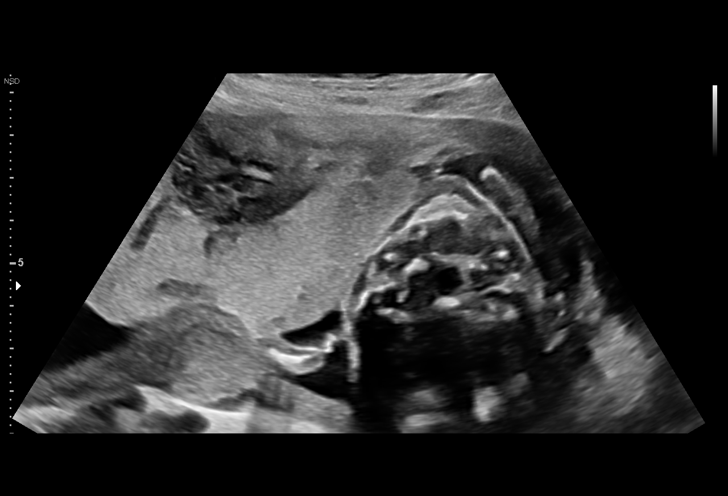

[13 of 28 positions shown; findings below may reference images not displayed]

[REDACTED]care - [HOSPITAL]

 ----------------------------------------------------------------------

 ----------------------------------------------------------------------
Indications

  19 weeks gestation of pregnancy
  Encounter for antenatal screening for
  malformations (LOW risk NIPS, Negative
  AFP)
  Encounter for uncertain dates
 ----------------------------------------------------------------------
Fetal Evaluation

 Num Of Fetuses:          1
 Fetal Heart Rate(bpm):   135
 Cardiac Activity:        Observed
 Presentation:            Cephalic
 Placenta:                Anterior
 P. Cord Insertion:       Visualized

 Amniotic Fluid
 AFI FV:      Within normal limits

                             Largest Pocket(cm)

Biometry

 BPD:      46.1  mm     G. Age:  20w 0d         72  %    CI:        70.87   %    70 - 86
                                                         FL/HC:       15.9  %    16.1 -
 HC:      174.5  mm     G. Age:  20w 0d         68  %    HC/AC:       1.27       1.09 -
 AC:      137.7  mm     G. Age:  19w 1d         38  %    FL/BPD:      60.1  %
 FL:       27.7  mm     G. Age:  18w 3d         14  %    FL/AC:       20.1  %    20 - 24
 HUM:      27.9  mm     G. Age:  19w 0d         39  %
 CER:      20.2  mm     G. Age:  19w 1d         46  %
 NFT:       4.2  mm
 LV:        5.8  mm
 CM:        6.5  mm

 Est. FW:     268   gm     0 lb 9 oz     38  %
OB History

 Gravidity:    2         Term:   0        Prem:   0        SAB:   1
 TOP:          0       Ectopic:  0        Living: 0
Gestational Age

 LMP:           19w 5d        Date:  03/19/18                 EDD:   12/24/18
 U/S Today:     19w 3d                                        EDD:   12/26/18
 Best:          19w 3d     Det. By:  U/S (08/04/18)           EDD:   12/26/18
Anatomy

 Cranium:               Appears normal         LVOT:                   Appears normal
 Cavum:                 Appears normal         Aortic Arch:            Appears normal
 Ventricles:            Appears normal         Ductal Arch:            Appears normal
 Choroid Plexus:        Appears normal         Diaphragm:              Appears normal
 Cerebellum:            Appears normal         Stomach:                Appears normal, left
                                                                       sided
 Posterior Fossa:       Appears normal         Abdomen:                Appears normal
 Nuchal Fold:           Appears normal         Abdominal Wall:         Appears nml (cord
                                                                       insert, abd wall)
 Face:                  Appears normal         Cord Vessels:           Appears normal (3
                        (orbits and profile)                           vessel cord)
 Lips:                  Not well visualized    Kidneys:                Appear normal
 Palate:                Not well visualized    Bladder:                Appears normal
 Thoracic:              Appears normal         Spine:                  Appears normal
 Heart:                 Appears normal         Upper Extremities:      Appears normal
                        (4CH, axis, and
                        situs)
 RVOT:                  Appears normal         Lower Extremities:      Appears normal

 Other:  Fetus appears to be a male. Heels and 5th digit visualized. Nasal
         bone visualized.
Cervix Uterus Adnexa

 Cervix
 Length:           2.96  cm.
 Normal appearance by transabdominal scan.

 Left Ovary
 Not visualized.

 Right Ovary
 Within normal limits.

 Adnexa
 No abnormality visualized.
Impression

 Normal interval growth.  No ultrasonic evidence of structural
 fetal anomalies.
 Suboptimal views of the fetal nose and lips were obtained
 secondary to fetal position.
Recommendations

 Follow up anatomy in 4 weeks.

## 2019-04-08 ENCOUNTER — Other Ambulatory Visit: Payer: Self-pay | Admitting: Obstetrics & Gynecology

## 2019-04-08 DIAGNOSIS — F329 Major depressive disorder, single episode, unspecified: Secondary | ICD-10-CM

## 2019-04-08 DIAGNOSIS — O99343 Other mental disorders complicating pregnancy, third trimester: Secondary | ICD-10-CM

## 2019-04-15 NOTE — Telephone Encounter (Signed)
V.O. to refill patient's Zoloft for one year.  Advised patient to establish care with a PCP for ongoing management of her depressive disorder.   Refill routed to pharmacy.

## 2020-04-28 ENCOUNTER — Other Ambulatory Visit: Payer: Self-pay | Admitting: Obstetrics and Gynecology

## 2020-04-28 DIAGNOSIS — F32A Depression, unspecified: Secondary | ICD-10-CM

## 2020-09-07 ENCOUNTER — Encounter: Payer: Self-pay | Admitting: Obstetrics and Gynecology

## 2020-09-07 ENCOUNTER — Other Ambulatory Visit: Payer: Self-pay

## 2020-09-07 ENCOUNTER — Other Ambulatory Visit (HOSPITAL_COMMUNITY)
Admission: RE | Admit: 2020-09-07 | Discharge: 2020-09-07 | Disposition: A | Discharge status: Home / Self Care | Payer: Medicaid Other | Source: Ambulatory Visit | Attending: Obstetrics and Gynecology | Admitting: Obstetrics and Gynecology

## 2020-09-07 ENCOUNTER — Ambulatory Visit (INDEPENDENT_AMBULATORY_CARE_PROVIDER_SITE_OTHER): Payer: Self-pay | Admitting: Obstetrics and Gynecology

## 2020-09-07 VITALS — BP 114/68 | HR 80 | Resp 18 | Ht 62.5 in | Wt 145.0 lb

## 2020-09-07 DIAGNOSIS — Z0000 Encounter for general adult medical examination without abnormal findings: Secondary | ICD-10-CM

## 2020-09-07 DIAGNOSIS — Z124 Encounter for screening for malignant neoplasm of cervix: Secondary | ICD-10-CM | POA: Diagnosis present

## 2020-09-07 DIAGNOSIS — B3731 Acute candidiasis of vulva and vagina: Secondary | ICD-10-CM

## 2020-09-07 DIAGNOSIS — N898 Other specified noninflammatory disorders of vagina: Secondary | ICD-10-CM

## 2020-09-07 DIAGNOSIS — N920 Excessive and frequent menstruation with regular cycle: Secondary | ICD-10-CM

## 2020-09-07 DIAGNOSIS — Z01419 Encounter for gynecological examination (general) (routine) without abnormal findings: Secondary | ICD-10-CM

## 2020-09-07 DIAGNOSIS — F419 Anxiety disorder, unspecified: Secondary | ICD-10-CM

## 2020-09-07 DIAGNOSIS — B373 Candidiasis of vulva and vagina: Secondary | ICD-10-CM

## 2020-09-07 DIAGNOSIS — Z3009 Encounter for other general counseling and advice on contraception: Secondary | ICD-10-CM

## 2020-09-07 DIAGNOSIS — Z8659 Personal history of other mental and behavioral disorders: Secondary | ICD-10-CM

## 2020-09-07 LAB — WET PREP FOR TRICH, YEAST, CLUE

## 2020-09-07 LAB — COMPREHENSIVE METABOLIC PANEL
Chloride: 104 mmol/L (ref 98–110)
Potassium: 3.9 mmol/L (ref 3.5–5.3)

## 2020-09-07 MED ORDER — SERTRALINE HCL 50 MG PO TABS: 50.0000 mg | ORAL_TABLET | Freq: Every day | ORAL | 1 refills | Status: DC

## 2020-09-07 MED ORDER — SERTRALINE HCL 100 MG PO TABS: 100.0000 mg | ORAL_TABLET | Freq: Every day | ORAL | 3 refills | Status: DC

## 2020-09-07 MED ORDER — DROSPIRENONE-ETHINYL ESTRADIOL 3-0.02 MG PO TABS: 1.0000 | ORAL_TABLET | Freq: Every day | ORAL | 0 refills | Status: DC

## 2020-09-07 MED ORDER — FLUCONAZOLE 150 MG PO TABS: 150.0000 mg | ORAL_TABLET | Freq: Once | ORAL | 0 refills | Status: AC

## 2020-09-07 NOTE — Progress Notes (Signed)
30 y.o. G49P1011 Married Other or two or more races Not Hispanic or Latino female here for annual exam. Patient c/o having vaginal discharge that is white and sometimes "milky" and vulvar itching.   She c/o a couple of week h/o an increase in vaginal d/c, mild itching no odor.   Sexually active, no pain.   Period Cycle (Days): 28 Period Duration (Days): 5 Period Pattern: Regular Menstrual Flow: Heavy Menstrual Control: Maxi pad Menstrual Control Change Freq (Hours): 30 min - 1 hour Dysmenorrhea: (!) Moderate Dysmenorrhea Symptoms: Cramping,Nausea,Headache  Cycles are very heavy for 2 days. Prior to having her son she would saturate a pad in 3-4 hours.   H/O acne, currently better.   She is on Zoloft 100 mg daily for depression. Feels her depression is not well enough controlled, would like to increase her dose.  Patient's last menstrual period was 08/13/2020.          Sexually active: Yes.    The current method of family planning is condoms Exercising: Yes.    HIIT Smoker:  Patient occasionally smokes HOOKAH  Health Maintenance: Pap:  04/15/18 Neg History of abnormal Pap:  no TDaP:  10/14/2018 Gardasil: never   reports that she has been smoking pipe. She has never used smokeless tobacco. She reports that she does not drink alcohol and does not use drugs. 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   Past Medical History:  Diagnosis Date  . Allergy   . Anxiety     Past Surgical History:  Procedure Laterality Date  . tonsilect    . TONSILLECTOMY      Current Outpatient Medications  Medication Sig Dispense Refill  . ibuprofen (ADVIL) 600 MG tablet Take 1 tablet (600 mg total) by mouth every 6 (six) hours. 30 tablet 0  . sertraline (ZOLOFT) 100 MG tablet Take 1 tablet by mouth once daily (Patient taking differently: Take 100 mg by mouth daily.) 30 tablet 11   No current facility-administered medications for this visit.    Family History  Problem Relation Age of Onset  . ALS  Father   Father died in Jan 03, 2016.  Review of Systems  Constitutional: Negative.   HENT: Negative.   Eyes: Negative.   Respiratory: Negative.   Cardiovascular: Negative.   Gastrointestinal: Negative.   Endocrine: Negative.   Genitourinary: Positive for vaginal discharge.       Vulvar itching  Musculoskeletal: Negative.   Skin: Negative.   Allergic/Immunologic: Negative.   Neurological: Negative.   Hematological: Negative.   Psychiatric/Behavioral: Negative.     Exam:   BP 114/68 (BP Location: Right Arm, Patient Position: Sitting, Cuff Size: Normal)   Pulse 80   Resp 18   Ht 5' 2.5" (1.588 m)   Wt 145 lb (65.8 kg)   LMP 08/13/2020   BMI 26.10 kg/m   Weight change: @WEIGHTCHANGE @ Height:   Height: 5' 2.5" (158.8 cm)  Ht Readings from Last 3 Encounters:  09/07/20 5' 2.5" (1.588 m)  01/12/19 5\' 3"  (1.6 m)  12/27/18 5\' 3"  (1.6 m)    General appearance: alert, cooperative and appears stated age Head: Normocephalic, without obvious abnormality, atraumatic Neck: no adenopathy, supple, symmetrical, trachea midline and thyroid normal to inspection and palpation Lungs: clear to auscultation bilaterally Cardiovascular: regular rate and rhythm Breasts: normal appearance, no masses or tenderness Abdomen: soft, non-tender; non distended,  no masses,  no organomegaly Extremities: extremities normal, atraumatic, no cyanosis or edema Skin: Skin color, texture, turgor normal. No rashes or lesions  Lymph nodes: Cervical, supraclavicular, and axillary nodes normal. No abnormal inguinal nodes palpated Neurologic: Grossly normal   Pelvic: External genitalia:  no lesions              Urethra:  normal appearing urethra with no masses, tenderness or lesions              Bartholins and Skenes: normal                 Vagina: normal appearing vagina with normal color and discharge, no lesions              Cervix: no lesions               Bimanual Exam:  Uterus:  normal size, contour, position,  consistency, mobility, non-tender              Adnexa: no mass, fullness, tenderness               Rectovaginal: Confirms               Anus:  normal sphincter tone, no lesions  Wet prep: + yeast, moderate WBC, no trich, no clue  1. Well woman exam Discussed breast self exam Discussed calcium and vit D intake   2. Menorrhagia with regular cycle - CBC - Ferritin - TSH - drospirenone-ethinyl estradiol (YAZ) 3-0.02 MG tablet; Take 1 tablet by mouth daily.  Dispense: 84 tablet; Refill: 0 -f/u in 3 months, if her menorrhagia hasn't improved will order an ultrasound  3. General counseling and advice on female contraception No contraindications to OCP's, risks reviewed, also reviewed possible increased risk of clots on Yaz. - drospirenone-ethinyl estradiol (YAZ) 3-0.02 MG tablet; Take 1 tablet by mouth daily.  Dispense: 84 tablet; Refill: 0  4. Anxiety Feels she isn't controlled well enough on 100 mg of zoloft, will increase to 150 mg daily, call to check in in 1 month - sertraline (ZOLOFT) 100 MG tablet; Take 1 tablet (100 mg total) by mouth daily.  Dispense: 90 tablet; Refill: 3 - sertraline (ZOLOFT) 50 MG tablet; Take 1 tablet (50 mg total) by mouth daily. Add to the 100 mg tablet for a total dose of 150 mg  Dispense: 30 tablet; Refill: 1  5. History of depression Feels she isn't controlled well enough on 100 mg of zoloft, will increase to 150 mg daily, call to check in in 1 month - sertraline (ZOLOFT) 100 MG tablet; Take 1 tablet (100 mg total) by mouth daily.  Dispense: 90 tablet; Refill: 3  6. Screening for cervical cancer - Cytology - PAP  7. Vaginal discharge  - WET PREP FOR [place], [place], CLUE  8. Laboratory exam ordered as part of routine general medical examination  - Comprehensive metabolic panel - Lipid panel  9. Yeast vaginitis  - fluconazole (DIFLUCAN) 150 MG tablet; Take 1 tablet (150 mg total) by mouth once for 1 dose. Take one tablet.  Repeat in 72 hours if  symptoms are not completely resolved.  Dispense: 2 tablet; Refill: 0

## 2020-09-07 NOTE — Patient Instructions (Signed)
EXERCISE   We recommended that you start or continue a regular exercise program for good health. Physical activity is anything that gets your body moving, some is better than none. The CDC recommends 150 minutes per week of Moderate-Intensity Aerobic Activity and 2 or more days of Muscle Strengthening Activity.  Benefits of exercise are limitless: helps weight loss/weight maintenance, improves mood and energy, helps with depression and anxiety, improves sleep, tones and strengthens muscles, improves balance, improves bone density, protects from chronic conditions such as heart disease, high blood pressure and diabetes and so much more. To learn more visit: https://www.cdc.gov/physicalactivity/index.html  DIET: Good nutrition starts with a healthy diet of fruits, vegetables, whole grains, and lean protein sources. Drink plenty of water for hydration. Minimize empty calories, sodium, sweets. For more information about dietary recommendations visit: https://health.gov/our-work/nutrition-physical-activity/dietary-guidelines and https://www.myplate.gov/  ALCOHOL:  Women should limit their alcohol intake to no more than 7 drinks/beers/glasses of wine (combined, not each!) per week. Moderation of alcohol intake to this level decreases your risk of breast cancer and liver damage.  If you are concerned that you may have a problem, or your friends have told you they are concerned about your drinking, there are many resources to help. A well-known program that is free, effective, and available to all people all over the nation is Alcoholics Anonymous.  Check out this site to learn more: https://www.aa.org/   CALCIUM AND VITAMIN D:  Adequate intake of calcium and Vitamin D are recommended for bone health.  You should be getting between 1000-1200 mg of calcium and 800 units of Vitamin D daily between diet and supplements  PAP SMEARS:  Pap smears, to check for cervical cancer or precancers,  have traditionally been  done yearly, scientific advances have shown that most women can have pap smears less often.  However, every woman still should have a physical exam from her gynecologist every year. It will include a breast check, inspection of the vulva and vagina to check for abnormal growths or skin changes, a visual exam of the cervix, and then an exam to evaluate the size and shape of the uterus and ovaries. We will also provide age appropriate advice regarding health maintenance, like when you should have certain vaccines, screening for sexually transmitted diseases, bone density testing, colonoscopy, mammograms, etc.   MAMMOGRAMS:  All women over 40 years old should have a routine mammogram.   COLON CANCER SCREENING: Now recommend starting at age 45. At this time colonoscopy is not covered for routine screening until 50. There are take home tests that can be done between 45-49.   COLONOSCOPY:  Colonoscopy to screen for colon cancer is recommended for all women at age 50.  We know, you hate the idea of the prep.  We agree, BUT, having colon cancer and not knowing it is worse!!  Colon cancer so often starts as a polyp that can be seen and removed at colonscopy, which can quite literally save your life!  And if your first colonoscopy is normal and you have no family history of colon cancer, most women don't have to have it again for 10 years.  Once every ten years, you can do something that may end up saving your life, right?  We will be happy to help you get it scheduled when you are ready.  Be sure to check your insurance coverage so you understand how much it will cost.  It may be covered as a preventative service at no cost, but you should check   your particular policy.      Breast Self-Awareness Breast self-awareness means being familiar with how your breasts look and feel. It involves checking your breasts regularly and reporting any changes to your health care provider. Practicing breast self-awareness is  important. A change in your breasts can be a sign of a serious medical problem. Being familiar with how your breasts look and feel allows you to find any problems early, when treatment is more likely to be successful. All women should practice breast self-awareness, including women who have had breast implants. How to do a breast self-exam One way to learn what is normal for your breasts and whether your breasts are changing is to do a breast self-exam. To do a breast self-exam: Look for Changes  1. Remove all the clothing above your waist. 2. Stand in front of a mirror in a room with good lighting. 3. Put your hands on your hips. 4. Push your hands firmly downward. 5. Compare your breasts in the mirror. Look for differences between them (asymmetry), such as: ? Differences in shape. ? Differences in size. ? Puckers, dips, and bumps in one breast and not the other. 6. Look at each breast for changes in your skin, such as: ? Redness. ? Scaly areas. 7. Look for changes in your nipples, such as: ? Discharge. ? Bleeding. ? Dimpling. ? Redness. ? A change in position. Feel for Changes Carefully feel your breasts for lumps and changes. It is best to do this while lying on your back on the floor and again while sitting or standing in the shower or tub with soapy water on your skin. Feel each breast in the following way:  Place the arm on the side of the breast you are examining above your head.  Feel your breast with the other hand.  Start in the nipple area and make  inch (2 cm) overlapping circles to feel your breast. Use the pads of your three middle fingers to do this. Apply light pressure, then medium pressure, then firm pressure. The light pressure will allow you to feel the tissue closest to the skin. The medium pressure will allow you to feel the tissue that is a little deeper. The firm pressure will allow you to feel the tissue close to the ribs.  Continue the overlapping circles,  moving downward over the breast until you feel your ribs below your breast.  Move one finger-width toward the center of the body. Continue to use the  inch (2 cm) overlapping circles to feel your breast as you move slowly up toward your collarbone.  Continue the up and down exam using all three pressures until you reach your armpit.  Write Down What You Find  Write down what is normal for each breast and any changes that you find. Keep a written record with breast changes or normal findings for each breast. By writing this information down, you do not need to depend only on memory for size, tenderness, or location. Write down where you are in your menstrual cycle, if you are still menstruating. If you are having trouble noticing differences in your breasts, do not get discouraged. With time you will become more familiar with the variations in your breasts and more comfortable with the exam. How often should I examine my breasts? Examine your breasts every month. If you are breastfeeding, the best time to examine your breasts is after a feeding or after using a breast pump. If you menstruate, the best time to   examine your breasts is 5-7 days after your period is over. During your period, your breasts are lumpier, and it may be more difficult to notice changes. When should I see my health care provider? See your health care provider if you notice:  A change in shape or size of your breasts or nipples.  A change in the skin of your breast or nipples, such as a reddened or scaly area.  Unusual discharge from your nipples.  A lump or thick area that was not there before.  Pain in your breasts.  Anything that concerns you.  Oral Contraception Information Oral contraceptive pills (OCPs) are medicines taken to prevent pregnancy. OCPs are taken by mouth, and they work by:  Preventing the ovaries from releasing eggs.  Thickening mucus in the lower part of the uterus (cervix), which prevents  sperm from entering the uterus.  Thinning the lining of the uterus (endometrium), which prevents a fertilized egg from attaching to the endometrium. OCPs are highly effective when taken exactly as prescribed. However, OCPs do not prevent STIs (sexually transmitted infections). Safe sex practices, such as using condoms while on an OCP, can help prevent STIs. Before starting OCPs Before you start taking OCPs, you may have a physical exam, blood test, and Pap test. However, you are not required to have a pelvic exam in order to be prescribed OCPs. Your health care provider will make sure you are a good candidate for oral contraception. OCPs are not a good option for certain women, including women who smoke and are older than 35 years, and women with a medical history of high blood pressure, deep vein thrombosis, pulmonary embolism, stroke, cardiovascular disease, or peripheral vascular disease. Discuss with your health care provider the possible side effects of the OCP you may be prescribed. When you start an OCP, be aware that it can take 2-3 months for your body to adjust to changes in hormone levels. Follow instructions from your health care provider about how to start taking your first cycle of OCPs. Depending on when you start the pill, you may need to use a backup form of birth control, such as condoms, during the first week. Make sure you know what steps to take if you ever forget to take the pill. Types of oral contraception  The most common types of birth control pills contain the hormones estrogen and progestin (synthetic progesterone) or progestin only. The combination pill This type of pill contains estrogen and progestin hormones. Combination pills often come in packs of 21, 28, or 91 pills. For each pack, the last 7 pills may not contain hormones, which means you may stop taking the pills for 7 days. Menstrual bleeding occurs during the week that you do not take the pills or that you take the  pills with no hormones in them. The minipill This type of pill contains the progestin hormone only. It comes in packs of 28 pills. All 28 pills contain the hormone. You take the pill every day. It is very important to take the pill at the same time each day. Advantages of oral contraceptive pills  Provides reliable and continuous contraception if taken as instructed.  May treat or decrease symptoms of: ? Menstrual period cramps. ? Irregular menstrual cycle or bleeding. ? Heavy menstrual flow. ? Abnormal uterine bleeding. ? Acne, depending on the type of pill. ? Polycystic ovarian syndrome. ? Endometriosis. ? Iron deficiency anemia. ? Premenstrual symptoms, including premenstrual dysphoric disorder.  May reduce the risk of endometrial  and ovarian cancer.  Can be used as emergency contraception.  Prevents mislocated (ectopic) pregnancies and infections of the fallopian tubes. Things that can make oral contraceptive pills less effective OCPs can be less effective if:  You forget to take the pill at the same time every day. This is especially important when taking the minipill.  You have a stomach or intestinal disease that reduces your body's ability to absorb the pill.  You take OCPs with other medicines that make OCPs less effective, such as antibiotics, certain HIV medicines, and some seizure medicines.  You take expired OCPs.  You forget to restart the pill on day 7, if using the packs of 21 pills. Risks associated with oral contraceptive pills Oral contraceptive pills can sometimes cause side effects, such as:  Headache.  Depression.  Trouble sleeping.  Nausea and vomiting.  Breast tenderness.  Irregular bleeding or spotting during the first several months.  Bloating or fluid retention.  Increase in blood pressure. Combination pills are also associated with a small increase in the risk of:  Blood clots.  Heart attack.  Stroke. Summary  Oral  contraceptive pills are medicines taken by mouth to prevent pregnancy. They are highly effective when taken exactly as prescribed.  The most common types of birth control pills contain the hormones estrogen and progestin (synthetic progesterone) or progestin only.  Before you start taking the pill, you may have a physical exam, blood test, and Pap test. Your health care provider will make sure you are a good candidate for oral contraception.  The combination pill may come in a 21-day pack, a 28-day pack, or a 91-day pack. The minipill contains the progesterone hormone only and comes in packs of 28 pills.  Oral contraceptive pills can sometimes cause side effects, such as headache, nausea, breast tenderness, or irregular bleeding. This information is not intended to replace advice given to you by your health care provider. Make sure you discuss any questions you have with your health care provider. Document Revised: 07/31/2017 Document Reviewed: 11/11/2016 Elsevier Patient Education  [place].

## 2020-09-08 LAB — COMPREHENSIVE METABOLIC PANEL
AG Ratio: 1.2 (calc) (ref 1.0–2.5)
ALT: 11 U/L (ref 6–29)
AST: 16 U/L (ref 10–30)
Albumin: 4.2 g/dL (ref 3.6–5.1)
Alkaline phosphatase (APISO): 61 U/L (ref 31–125)
BUN: 9 mg/dL (ref 7–25)
CO2: 27 mmol/L (ref 20–32)
Calcium: 9.6 mg/dL (ref 8.6–10.2)
Creat: 0.59 mg/dL (ref 0.50–1.10)
Globulin: 3.4 g/dL (calc) (ref 1.9–3.7)
Glucose, Bld: 58 mg/dL — ABNORMAL LOW (ref 65–99)
Sodium: 139 mmol/L (ref 135–146)
Total Bilirubin: 0.7 mg/dL (ref 0.2–1.2)
Total Protein: 7.6 g/dL (ref 6.1–8.1)

## 2020-09-08 LAB — CBC
HCT: 39.6 % (ref 35.0–45.0)
Hemoglobin: 13.1 g/dL (ref 11.7–15.5)
MCH: 27.5 pg (ref 27.0–33.0)
MCHC: 33.1 g/dL (ref 32.0–36.0)
MCV: 83.2 fL (ref 80.0–100.0)
MPV: 10.8 fL (ref 7.5–12.5)
Platelets: 358 10*3/uL (ref 140–400)
RBC: 4.76 10*6/uL (ref 3.80–5.10)
RDW: 12.3 % (ref 11.0–15.0)
WBC: 5.7 10*3/uL (ref 3.8–10.8)

## 2020-09-08 LAB — LIPID PANEL
Cholesterol: 154 mg/dL (ref <200)
HDL: 53 mg/dL (ref >=50)
LDL Cholesterol (Calc): 82 mg/dL (calc)
Non-HDL Cholesterol (Calc): 101 mg/dL (calc) (ref <130)
Total CHOL/HDL Ratio: 2.9 (calc) (ref <5.0)
Triglycerides: 98 mg/dL (ref <150)

## 2020-09-08 LAB — TSH: TSH: 2.23 mIU/L

## 2020-09-08 LAB — FERRITIN: Ferritin: 10 ng/mL — ABNORMAL LOW (ref 16–154)

## 2020-09-13 LAB — CYTOLOGY - PAP: Diagnosis: NEGATIVE

## 2020-10-09 ENCOUNTER — Ambulatory Visit (INDEPENDENT_AMBULATORY_CARE_PROVIDER_SITE_OTHER): Payer: Medicaid Other | Admitting: Obstetrics and Gynecology

## 2020-10-09 ENCOUNTER — Other Ambulatory Visit: Payer: Self-pay

## 2020-10-09 ENCOUNTER — Encounter: Payer: Self-pay | Admitting: Obstetrics and Gynecology

## 2020-10-09 VITALS — BP 100/68 | HR 88 | Ht 62.5 in | Wt 145.0 lb

## 2020-10-09 DIAGNOSIS — O926 Galactorrhea: Secondary | ICD-10-CM

## 2020-10-09 DIAGNOSIS — R102 Pelvic and perineal pain: Secondary | ICD-10-CM

## 2020-10-09 DIAGNOSIS — Z8742 Personal history of other diseases of the female genital tract: Secondary | ICD-10-CM

## 2020-10-09 DIAGNOSIS — N920 Excessive and frequent menstruation with regular cycle: Secondary | ICD-10-CM | POA: Diagnosis not present

## 2020-10-09 DIAGNOSIS — Z8659 Personal history of other mental and behavioral disorders: Secondary | ICD-10-CM

## 2020-10-09 DIAGNOSIS — E049 Nontoxic goiter, unspecified: Secondary | ICD-10-CM | POA: Diagnosis not present

## 2020-10-09 DIAGNOSIS — F419 Anxiety disorder, unspecified: Secondary | ICD-10-CM | POA: Diagnosis not present

## 2020-10-09 MED ORDER — FLUCONAZOLE 150 MG PO TABS: 150.0000 mg | ORAL_TABLET | Freq: Once | ORAL | 0 refills | Status: AC

## 2020-10-09 MED ORDER — SERTRALINE HCL 50 MG PO TABS: 50.0000 mg | ORAL_TABLET | Freq: Every day | ORAL | 3 refills | Status: DC

## 2020-10-09 NOTE — Progress Notes (Unsigned)
GYNECOLOGY  VISIT   HPI: 30 y.o.   Married Other or two or more races Not Hispanic or Latino  female   [id number] with Patient's last menstrual period was 09/07/2020.   here for medication f/u. Patient discontinued Yaz due to mood changes, bloating, and lactating. Per patient, the day after she stopped the pill, she had a heavy period with "large clots". Per patient doing well with the Zoloft.  She was seen last month for her annual exam c/o menorrhagia. She wasn't anemic, but had low iron stores. TSH was normal. She was started on Yaz, but didn't tolerate it.   She was also feeling that her depression/anxiety wasn't well controlled on 100 mg of Zoloft so her dose was increased to 150 mg. Feels like her depression and anxiety are now controlled.   She has noticed some leakage from both of her breasts. Baby is 2. She stopped nursing 9 months ago. Thought maybe her top was wet, not sure, able to express milk bilaterally.   She c/o a 4 day h/o left neck pain. She does lift weights.   She c/o severe pain with intercourse last week. The pain was so severe she passed out. The pain subsided over 3 days. Currently feeling okay.   GYNECOLOGIC HISTORY: Patient's last menstrual period was 09/07/2020. Contraception: condoms. Menopausal hormone therapy: n/a        OB History    Gravida  2   Para  1   Term  1   Preterm  0   AB  1   Living  1     SAB  0   IAB  0   Ectopic  0   Multiple  0   Live Births  1              Patient Active Problem List   Diagnosis Date Noted  . Perineal laceration complicating delivery 88/41/6606  . 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 Allergy   . Anxiety     Past Surgical History:  Procedure Laterality Date  . tonsilect    . TONSILLECTOMY      Current Outpatient Medications  Medication Sig Dispense Refill  . ibuprofen (ADVIL) 600 MG tablet Take 1 tablet  (600 mg total) by mouth every 6 (six) hours. 30 tablet 0  . sertraline (ZOLOFT) 100 MG tablet Take 1 tablet (100 mg total) by mouth daily. 90 tablet 3  . sertraline (ZOLOFT) 50 MG tablet Take 1 tablet (50 mg total) by mouth daily. Add to the 100 mg tablet for a total dose of 150 mg 30 tablet 1  . sertraline (ZOLOFT) 100 MG tablet Take 1 tablet by mouth once daily (Patient taking differently: Take 100 mg by mouth daily.) 30 tablet 11   No current facility-administered medications for this visit.     ALLERGIES: Patient has no known allergies.  Family History  Problem Relation Age of Onset  . ALS Father     Social History   Socioeconomic History  . Marital status: Married    Spouse name: Not on file  . Number of children: Not on file  . Years of education: Not on file  . Highest education level: Not on file  Occupational History  . Not on file  Tobacco Use  . Smoking status: Current Some Day Smoker    Types: Pipe  . Smokeless tobacco: Never Used  Vaping Use  .  Vaping Use: Never used  Substance and Sexual Activity  . Alcohol use: No  . Drug use: No  . Sexual activity: Yes    Partners: Male    Birth control/protection: None  Other Topics Concern  . Not on file  Social History Narrative  . Not on file   Social Determinants of Health   Financial Resource Strain: Not on file  Food Insecurity: Not on file  Transportation Needs: Not on file  Physical Activity: Not on file  Stress: Not on file  Social Connections: Not on file  Intimate Partner Violence: Not on file    Review of Systems  Constitutional: Negative.   HENT: Negative.   Eyes: Negative.   Respiratory: Negative.   Cardiovascular: Negative.   Gastrointestinal: Negative.   Genitourinary: Negative.   Musculoskeletal: Negative.   Skin: Negative.   Neurological: Negative.   Endo/Heme/Allergies: Negative.   Psychiatric/Behavioral: Negative.     PHYSICAL EXAMINATION:    BP 100/68 (BP Location: Left Arm,  Patient Position: Sitting, Cuff Size: Normal)   Pulse 88   Ht 5' 2.5" (1.588 m)   Wt 145 lb (65.8 kg)   LMP 09/07/2020   BMI 26.10 kg/m     General appearance: alert, cooperative and appears stated age Neck: no adenopathy, supple, symmetrical, trachea midline and thyroid the left lobe of the thyroid is slightly larger on the left, also tender.    Pelvic: External genitalia:  no lesions              Urethra:  normal appearing urethra with no masses, tenderness or lesions              Bartholins and Skenes: normal                 Cervix: no cervical motion tenderness              Bimanual Exam:  Uterus:  normal size, contour, position, consistency, mobility, non-tender              Adnexa: no mass, fullness, tenderness              Pelvic floor: mildly tender on the right only.   Chaperone was present for exam.  1. History of depression Doing well on 150 mg of zoloft, has a script for the 100 mg tablet, will refill the 50 mg tablet - sertraline (ZOLOFT) 50 MG tablet; Take 1 tablet (50 mg total) by mouth daily. Add to the 100 mg tablet for a total dose of 150 mg  Dispense: 90 tablet; Refill: 3  2. Anxiety See above - sertraline (ZOLOFT) 50 MG tablet; Take 1 tablet (50 mg total) by mouth daily. Add to the 100 mg tablet for a total dose of 150 mg  Dispense: 90 tablet; Refill: 3  3. Thyroid enlarged - US THYROID; Future  4. Menorrhagia with regular cycle Didn't tolerate OCP's - US PELVIS TRANSVAGINAL NON-OB (TV ONLY); Future  5. History of vaginitis Lost one of her diflucan, refill given - fluconazole (DIFLUCAN) 150 MG tablet; Take 1 tablet (150 mg total) by mouth once for 1 dose. Take one tablet orally  Dispense: 1 tablet; Refill: 0  6. Galactorrhea associated with childbirth Stopped nursing 9 months ago. Notices when she expresses. Recommended not to express.   7. Pelvic pain Mild pelvic floor tenderness on the right, otherwise normal exam. Suspect she may have had a ruptured  ovarian cyst.  - US PELVIS TRANSVAGINAL NON-OB (TV ONLY); Future  Addendum After her exam she mentioned that she lost one of her diflucan pills and request replacement. Had yeast last month, symptoms improved, still mild discomfort

## 2020-10-22 ENCOUNTER — Other Ambulatory Visit: Payer: Medicaid Other

## 2020-11-01 ENCOUNTER — Encounter: Payer: Self-pay | Admitting: Obstetrics and Gynecology

## 2020-11-01 ENCOUNTER — Ambulatory Visit (INDEPENDENT_AMBULATORY_CARE_PROVIDER_SITE_OTHER): Payer: Medicaid Other

## 2020-11-01 ENCOUNTER — Other Ambulatory Visit: Payer: Self-pay

## 2020-11-01 ENCOUNTER — Ambulatory Visit (INDEPENDENT_AMBULATORY_CARE_PROVIDER_SITE_OTHER): Payer: Medicaid Other | Admitting: Obstetrics and Gynecology

## 2020-11-01 VITALS — BP 110/68 | HR 78 | Ht 63.0 in | Wt 139.0 lb

## 2020-11-01 DIAGNOSIS — R102 Pelvic and perineal pain: Secondary | ICD-10-CM

## 2020-11-01 DIAGNOSIS — D259 Leiomyoma of uterus, unspecified: Secondary | ICD-10-CM

## 2020-11-01 DIAGNOSIS — N920 Excessive and frequent menstruation with regular cycle: Secondary | ICD-10-CM | POA: Diagnosis not present

## 2020-11-01 NOTE — Patient Instructions (Signed)
Uterine Fibroids  Uterine fibroids, also called leiomyomas, are noncancerous (benign) tumors that can grow in the uterus. They can cause heavy menstrual bleeding and pain. Fibroids may also grow in the fallopian tubes, cervix, or tissues (ligaments) near the uterus. You may have one or many fibroids. Fibroids vary in size, weight, and where they grow in the uterus. Some can become quite large. Most fibroids do not require medical treatment. What are the causes? The cause of this condition is not known. What increases the risk? You are more likely to develop this condition if you:  Are in your 30s or 40s and have not gone through menopause.  Have a family history of this condition.  Are of African American descent.  Started your menstrual period at age 102 or younger.  Have never given birth.  Are overweight or obese. What are the signs or symptoms? Many women do not have any symptoms. Symptoms of this condition may include:  Heavy menstrual bleeding.  Bleeding between menstrual periods.  Pain and pressure in the pelvic area, between your hip bones.  Pain during sex.  Bladder problems, such as needing to urinate right away or more often than usual.  Inability to have children (infertility).  Failure to carry pregnancy to term (miscarriage). How is this diagnosed? This condition may be diagnosed based on:  Your symptoms and medical history.  A physical exam.  A pelvic exam that includes feeling for any tumors.  Imaging tests, such as ultrasound or MRI. How is this treated? Treatment for this condition may include follow-up visits with your health care provider to monitor your fibroids for any changes. Other treatment may include:  Medicines, such as: ? Medicines to relieve pain, including aspirin and NSAIDs, such as ibuprofen or naproxen. ? Hormone therapy. Treatment may be given as a pill or an injection, or it may be inserted into the uterus using an intrauterine  device (IUD).  Surgery that would do one of the following: ? Remove the fibroids (myomectomy). This may be recommended if fibroids affect your fertility and you want to become pregnant. ? Remove the uterus (hysterectomy). ? Block the blood supply to the fibroids (uterine artery embolization). This can cause them to shrink and die. Follow these instructions at home: Medicines  Take over-the-counter and prescription medicines only as told by your health care provider.  Ask your health care provider if you should take iron pills or eat more iron-rich foods, such as dark green, leafy vegetables. Heavy menstrual bleeding can cause low iron levels. Managing pain If directed, apply heat to your back or abdomen to reduce pain. Use the heat source that your health care provider recommends, such as a moist heat pack or a heating pad. To apply heat:  Place a towel between your skin and the heat source.  Leave the heat on for 20-30 minutes.  Remove the heat if your skin turns bright red. This is especially important if you are unable to feel pain, heat, or cold. You may have a greater risk of getting burned.   General instructions  Pay close attention to your menstrual cycle. Tell your health care provider about any changes, such as: ? Heavier bleeding that requires you to change your pads or tampons more than usual. ? A change in the number of days that your menstrual period lasts. ? A change in symptoms that come with your menstrual period, such as back pain or cramps in your abdomen.  Keep all follow-up visits. This is  important, especially if your fibroids need to be monitored for any changes. Contact a health care provider if you:  Have pelvic pain, back pain, or cramps in your abdomen that do not get better with medicine or heat.  Develop new bleeding between menstrual periods.  Have increased bleeding during or between menstrual periods.  Feel more tired or weak than usual.  Feel  light-headed. Get help right away if you:  Faint.  Have pelvic pain that suddenly gets worse.  Have severe vaginal bleeding that soaks a tampon or pad in 30 minutes or less. Summary  Uterine fibroids are noncancerous (benign) tumors that can develop in the uterus.  The exact cause of this condition is not known.  Most fibroids do not require medical treatment unless they affect your ability to have children (fertility).  Contact a health care provider if you have pelvic pain, back pain, or cramps in your abdomen that do not get better with medicines.  Get help right away if you faint, have pelvic pain that suddenly gets worse, or have severe vaginal bleeding. This information is not intended to replace advice given to you by your health care provider. Make sure you discuss any questions you have with your health care provider. Document Revised: 03/20/2020 Document Reviewed: 03/20/2020 Elsevier Patient Education  [place].

## 2020-11-01 NOTE — Progress Notes (Signed)
GYNECOLOGY  VISIT   HPI: 30 y.o.   Married Other or two or more races Not Hispanic or Latino  female   [id number] with Patient's last menstrual period was 10/11/2020.   here for ultrasound consult    The patient has a h/o menorrhagia and episode of pelvic pain here for ultrasound evaluation. Not anemic, but low iron stores. Normal TSH. She was started on OCP's 2 months ago, but stopped it within a month secondary to mood changes, bloating and lactation (stopped nursing within the last year).  She has started iron and finds the bleeding tolerable.  Planning to start trying to get pregnant again in a few months.   GYNECOLOGIC HISTORY: Patient's last menstrual period was 10/11/2020. Contraception: condoms  Menopausal hormone therapy: none         OB History    Gravida  2   Para  1   Term  1   Preterm  0   AB  1   Living  1     SAB  0   IAB  0   Ectopic  0   Multiple  0   Live Births  1              Patient Active Problem List   Diagnosis Date Noted  . Perineal laceration complicating delivery 28/78/6767  . 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 Allergy   . Anxiety     Past Surgical History:  Procedure Laterality Date  . tonsilect    . TONSILLECTOMY      Current Outpatient Medications  Medication Sig Dispense Refill  . ibuprofen (ADVIL) 600 MG tablet Take 1 tablet (600 mg total) by mouth every 6 (six) hours. 30 tablet 0  . sertraline (ZOLOFT) 100 MG tablet Take 1 tablet (100 mg total) by mouth daily. 90 tablet 3  . sertraline (ZOLOFT) 50 MG tablet Take 1 tablet (50 mg total) by mouth daily. Add to the 100 mg tablet for a total dose of 150 mg 90 tablet 3   No current facility-administered medications for this visit.     ALLERGIES: Patient has no known allergies.  Family History  Problem Relation Age of Onset  . ALS Father     Social History   Socioeconomic History  .  Marital status: Married    Spouse name: Not on file  . Number of children: Not on file  . Years of education: Not on file  . Highest education level: Not on file  Occupational History  . Not on file  Tobacco Use  . Smoking status: Current Some Day Smoker    Types: Pipe  . Smokeless tobacco: Never Used  Vaping Use  . Vaping Use: Never used  Substance and Sexual Activity  . Alcohol use: No  . Drug use: No  . Sexual activity: Yes    Partners: Male    Birth control/protection: None  Other Topics Concern  . Not on file  Social History Narrative  . Not on file   Social Determinants of Health   Financial Resource Strain: Not on file  Food Insecurity: Not on file  Transportation Needs: Not on file  Physical Activity: Not on file  Stress: Not on file  Social Connections: Not on file  Intimate Partner Violence: Not on file    Review of Systems  All other systems reviewed and are negative.   PHYSICAL EXAMINATION:  BP 110/68   Pulse 78   Ht 5\' 3"  (1.6 m)   Wt 139 lb (63 kg)   LMP 10/11/2020   SpO2 98%   BMI 24.62 kg/m     General appearance: alert, cooperative and appears stated age  Pelvic ultrasound  Indications: Menorrhagia  Findings:  Anteverted Uterus 7.36 x 5.25 x 3.79 cm  Some streaking/shadowing, can't r/o adenomyosis  Fibroids:  1) 8 mm, intramural  2) 9 mm, posterior and subserosal  Endometrium 9.42 mm  Left ovary 2.26 x 1.65 x 1.39 cm  Right ovary 3.47 x 2.64 x 2.13 cm  No free fluid   Impression:  Anteverted uterus with 2 small myomas (intramural and subserosal), possible adenomyosis. Normal symmetrical endometrium Normal ovaries bilaterally No free fluid  1. Menorrhagia with regular cycle Low iron stores, but not anemic. Normal thyroid. No intracavitary defects on ultrasound. -discussed options of OCP's, POP's and the mirena IUD. She declines -plans to start trying to get pregnant in a couple of months  2. Uterine leiomyoma,  unspecified location Small and intramural/subserosal Information given

## 2021-01-21 ENCOUNTER — Encounter: Payer: Self-pay | Admitting: Obstetrics and Gynecology

## 2021-01-29 ENCOUNTER — Ambulatory Visit
Admission: RE | Admit: 2021-01-29 | Discharge: 2021-01-29 | Disposition: A | Discharge status: Home / Self Care | Payer: Medicaid Other | Source: Ambulatory Visit | Attending: Family Medicine | Admitting: Family Medicine

## 2021-01-29 ENCOUNTER — Other Ambulatory Visit: Payer: Self-pay

## 2021-01-29 VITALS — BP 114/81 | HR 71 | Temp 97.9°F | Resp 17

## 2021-01-29 DIAGNOSIS — L03032 Cellulitis of left toe: Secondary | ICD-10-CM

## 2021-01-29 MED ORDER — DOXYCYCLINE HYCLATE 100 MG PO CAPS: 100.0000 mg | ORAL_CAPSULE | Freq: Two times a day (BID) | ORAL | 0 refills | Status: DC

## 2021-01-29 NOTE — ED Provider Notes (Addendum)
[facility]    CSN: [id number] Arrival date & time: 01/29/21  1249      History   Chief Complaint Chief Complaint  Patient presents with  . Nail Problem    HPI Emily Wilkins is a 30 y.o. female.   HPI  Patient presents today with left third toe pain and swelling along with redness.  Patient reports that she clipped a hangnail and suspects this is when the infection develop.  She is afebrile.  She also has an abrasion on her right knee from an unrelated fall.  She was concerned it was also becoming infected.    Past Medical History:  Diagnosis Date  . Allergy   . Anxiety     Patient Active Problem List   Diagnosis Date Noted  . Perineal laceration complicating delivery 67/59/1638  . 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 tonsilect    . TONSILLECTOMY      OB History    Gravida  2   Para  1   Term  1   Preterm  0   AB  1   Living  1     SAB  0   IAB  0   Ectopic  0   Multiple  0   Live Births  1            Home Medications    Prior to Admission medications   Medication Sig Start Date End Date Taking? Authorizing Provider  doxycycline (VIBRAMYCIN) 100 MG capsule Take 1 capsule (100 mg total) by mouth 2 (two) times daily. 01/29/21  Yes Scot Jun, FNP  ibuprofen (ADVIL) 600 MG tablet Take 1 tablet (600 mg total) by mouth every 6 (six) hours. 12/29/18   Seabron Spates, CNM  sertraline (ZOLOFT) 100 MG tablet Take 1 tablet (100 mg total) by mouth daily. 09/07/20   Salvadore Dom, MD  sertraline (ZOLOFT) 50 MG tablet Take 1 tablet (50 mg total) by mouth daily. Add to the 100 mg tablet for a total dose of 150 mg 10/09/20   Salvadore Dom, MD    Family History Family History  Problem Relation Age of Onset  . ALS Father     Social History Social History   Tobacco Use  . Smoking status: Current Some Day Smoker    Types:  Pipe  . Smokeless tobacco: Never Used  Vaping Use  . Vaping Use: Never used  Substance Use Topics  . Alcohol use: No  . Drug use: No     Allergies   Patient has no known allergies.   Review of Systems Review of Systems Pertinent negatives listed in HPI   Physical Exam Triage Vital Signs ED Triage Vitals  Enc Vitals Group     BP 01/29/21 1349 114/81     Pulse Rate 01/29/21 1349 71     Resp 01/29/21 1349 17     Temp 01/29/21 1349 97.9 F (36.6 C)     Temp src --      SpO2 01/29/21 1349 99 %     Weight --      Height --      Head Circumference --      Peak Flow --      Pain Score 01/29/21 1348 7     Pain Loc --      Pain Edu? --      Excl. in [place]? --  No data found.  Updated Vital Signs BP 114/81 (BP Location: Left Arm)   Pulse 71   Temp 97.9 F (36.6 C)   Resp 17   LMP 01/20/2021   SpO2 99%   Breastfeeding No   Visual Acuity Right Eye Distance:   Left Eye Distance:   Bilateral Distance:    Right Eye Near:   Left Eye Near:    Bilateral Near:     Physical Exam General appearance: Alert, cooperative, no distress Head: Normocephalic, without obvious abnormality, atraumatic Respiratory: Respirations even and unlabored, normal respiratory rate Heart: Rate and rhythm normal. No gallop or murmurs noted on exam  Extremities: Left third toenail paronychia present visible pus lateral aspect of the cuticle   Neurovascular intact UC Treatments / Results  Labs (all labs ordered are listed, but only abnormal results are displayed) Labs Reviewed - No data to display  EKG   Radiology No results found.  Procedures Excise Ingrown Toenail  Date/Time: 01/29/2021 3:37 PM Performed by: Scot Jun, FNP Authorized by: Scot Jun, FNP   Consent:    Consent obtained:  Verbal   Consent given by:  Patient   Risks discussed:  Bleeding and incomplete removal Anesthesia:    Anesthesia method:  Local infiltration   Local anesthetic:  Lidocaine  1% w/o epi Nail Removal:    Nail bed repaired: no     Removed nail replaced and anchored: no   Post-procedure details:    Procedure completion:  Tolerated well, no immediate complications Comments:     Toenail remained intact cuticle drained of pus following instillation of lidocaine.     (including critical care time)  Medications Ordered in UC Medications - No data to display  Initial Impression / Assessment and Plan / UC Course  I have reviewed the triage vital signs and the nursing notes.  Pertinent labs & imaging results that were available during my care of the patient were reviewed by me and considered in my medical decision making (see chart for details).   Paronychia of the third toe left, was able to express pus from the cuticle with instillation of lidocaine therefore no I&D was performed.  Treating patient with doxycycline 100 mg twice daily for 10 days.  Bleeding controlled.  Return precautions if symptoms worsen or do not resolve with medication.  Final Clinical Impressions(s) / UC Diagnoses   Final diagnoses:  Paronychia of fourth toe, left   Discharge Instructions   None    ED Prescriptions    Medication Sig Dispense Auth. Provider   doxycycline (VIBRAMYCIN) 100 MG capsule Take 1 capsule (100 mg total) by mouth 2 (two) times daily. 20 capsule Scot Jun, FNP     PDMP not reviewed this encounter.   Scot Jun, FNP 01/29/21 [place], [place], FNP 01/29/21 1538

## 2021-01-29 NOTE — ED Triage Notes (Signed)
Pt is present today with left third toe infection. Pt states that she went to take her towel off and it ripped her hang nail off on that third toe.Patient noticed two days ago she felt pain, swelling, and red irritation

## 2021-03-19 ENCOUNTER — Encounter: Payer: Self-pay | Admitting: Obstetrics and Gynecology

## 2021-03-19 ENCOUNTER — Ambulatory Visit (INDEPENDENT_AMBULATORY_CARE_PROVIDER_SITE_OTHER): Payer: Medicaid Other | Admitting: Obstetrics and Gynecology

## 2021-03-19 ENCOUNTER — Other Ambulatory Visit: Payer: Self-pay

## 2021-03-19 ENCOUNTER — Telehealth: Payer: Self-pay

## 2021-03-19 VITALS — BP 100/74 | HR 80 | Ht 63.0 in | Wt 156.4 lb

## 2021-03-19 DIAGNOSIS — N926 Irregular menstruation, unspecified: Secondary | ICD-10-CM

## 2021-03-19 DIAGNOSIS — N760 Acute vaginitis: Secondary | ICD-10-CM | POA: Diagnosis not present

## 2021-03-19 LAB — WET PREP FOR TRICH, YEAST, CLUE

## 2021-03-19 MED ORDER — BETAMETHASONE VALERATE 0.1 % EX OINT
1.0000 | TOPICAL_OINTMENT | Freq: Two times a day (BID) | CUTANEOUS | 0 refills | Status: DC
Start: 2021-03-19 — End: 2022-09-08

## 2021-03-19 NOTE — Telephone Encounter (Signed)
I contacted patient back through My Chart and offered her the 2:15 appt today.  I also called and left message in her voicemail.  I went ahead and scheduled the appointment. Will wait to hear from her.

## 2021-03-19 NOTE — Telephone Encounter (Signed)
Patient contact us through My Chart email to schedule visit for vaginal infection.   Her symptoms are "I'm experiencing a slight odor, more discharge and some irritation."  No available openings this week.  Please advise.

## 2021-03-19 NOTE — Progress Notes (Signed)
GYNECOLOGY  VISIT   HPI: 30 y.o.   Married Other or two or more races Not Hispanic or Latino  female   [id number] with Patient's last menstrual period was 03/14/2021.   here for vaginal odor , increased discharge, and vaginal irritation. Patient states that she noticed her symptoms before her period last Thursday. Her symptoms are mild, started ~3 weeks ago. The discharge is brown and watery, occasionally chunky. She has mild vaginal odor, slight itching.   She has been trying to get pregnant for 4 months, worried that it hasn't happened. She has had 1-2 irregular cycles. Mostly monthly. No thyroid c/o, no galactorrhea.  She had a negative home pregnancy test last week.   GYNECOLOGIC HISTORY: Patient's last menstrual period was 03/14/2021. 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 30 tablet 0   sertraline (ZOLOFT) 100 MG tablet Take 1 tablet (100 mg total) by mouth daily. 90 tablet 3   sertraline (ZOLOFT) 50 MG tablet Take 1 tablet (50 mg total) by mouth daily. Add to the 100 mg tablet for a total dose of 150 mg 90 tablet 3   No current facility-administered medications for this visit.     ALLERGIES: Patient has no known allergies.  Family History  Problem Relation Age of Onset   ALS  Father     Social History   Socioeconomic History   Marital status: Married    Spouse name: Not on file   Number of children: Not on file   Years of education: Not on file   Highest education level: Not on file  Occupational History   Not on file  Tobacco Use   Smoking status: Some Days    Types: Pipe   Smokeless tobacco: Never  Vaping Use   Vaping Use: Never used  Substance and Sexual Activity   Alcohol use: No   Drug use: No   Sexual activity: Yes    Partners: Male    Birth control/protection: None  Other Topics Concern   Not on file  Social History Narrative   Not on file   Social Determinants of Health   Financial Resource Strain: Not on file  Food Insecurity: Not on file  Transportation Needs: Not on file  Physical Activity: Not on file  Stress: Not on file  Social Connections: Not on file  Intimate Partner Violence: Not on file    Review of Systems  Genitourinary:        Vaginal order  Increased  vaginal discharge Vaginal irritation.   All other systems reviewed and are negative.  PHYSICAL EXAMINATION:    BP 100/74   Pulse 80   Ht 5\' 3"  (1.6 m)   Wt 156 lb 6.4 oz (70.9 kg)   LMP 03/14/2021   SpO2 99%   BMI 27.71 kg/m     General appearance: alert, cooperative and appears stated age  Pelvic: External genitalia:  no lesions              Urethra:  normal appearing urethra with no masses, tenderness or lesions              Bartholins and Skenes: normal                 Vagina: normal appearing vagina with normal color and discharge, no lesions              Cervix: no lesions                Chaperone was present for exam.  1. Acute vaginitis - WET PREP FOR TRICH, YEAST, CLUE: negative - betamethasone valerate ointment (VALISONE) 0.1 %; Apply 1 application topically 2 (two) times daily.  Dispense: 30 g; Refill: 0 - SureSwab Bacterial Vaginosis/itis  2. Irregular menstrual cycle Recently with some menstrual changes. Negative UPT last week -She  will calendar her cycles. Reviewed parameters as to when she should be evaluated -Return with menstrual calendar if cycles continue to be irregular. -Reassured that it can take a normal fertile couple up to a year to conceive.

## 2021-03-24 LAB — SURESWAB BACTERIAL VAGINOSIS/ITIS
Atopobium vaginae: NOT DETECTED Log (cells/mL)
C. albicans, DNA: NOT DETECTED
C. glabrata, DNA: NOT DETECTED
C. parapsilosis, DNA: NOT DETECTED
C. tropicalis, DNA: NOT DETECTED
Gardnerella vaginalis: NOT DETECTED Log (cells/mL)
LACTOBACILLUS SPECIES: NOT DETECTED Log (cells/mL)
MEGASPHAERA SPECIES: NOT DETECTED Log (cells/mL)
Trichomonas vaginalis RNA: NOT DETECTED

## 2021-04-04 ENCOUNTER — Ambulatory Visit: Payer: Self-pay | Admitting: Obstetrics and Gynecology

## 2021-09-12 ENCOUNTER — Other Ambulatory Visit: Payer: Self-pay

## 2021-09-12 ENCOUNTER — Encounter: Payer: Self-pay | Admitting: Obstetrics and Gynecology

## 2021-09-12 ENCOUNTER — Ambulatory Visit (INDEPENDENT_AMBULATORY_CARE_PROVIDER_SITE_OTHER): Payer: Medicaid Other | Admitting: Obstetrics and Gynecology

## 2021-09-12 VITALS — BP 120/82 | HR 73 | Ht 63.0 in | Wt 144.0 lb

## 2021-09-12 DIAGNOSIS — N926 Irregular menstruation, unspecified: Secondary | ICD-10-CM | POA: Diagnosis not present

## 2021-09-12 LAB — HCG, QUANTITATIVE, PREGNANCY: HCG, Total, QN: <3 m[IU]/mL

## 2021-09-12 LAB — PREGNANCY, URINE: Preg Test, Ur: NEGATIVE

## 2021-09-12 NOTE — Progress Notes (Signed)
GYNECOLOGY  VISIT   HPI: 31 y.o.   Married Other or two or more races Not Hispanic or Latino  female   [id number] with Patient's last menstrual period was 07/18/2021.   here for missed period. She states that she was two days late starting her period and prior to bleeding she had a + pregnancy test. She did 3 upts and one of them was positive.   She has been trying to get pregnant for 9 months. This was the first cycle that was late, typically her cycles are every 26-28 days. This cycle was 30 days. It does seem like a normal cycle for her.   She isn't taking prenatal vitamin, wasn't tolerating them.   GYNECOLOGIC HISTORY: Patient's last menstrual period was 07/18/2021. 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 30 g 0   ibuprofen (ADVIL) 600 MG tablet Take 1 tablet (600 mg total) by mouth every 6 (six) hours. 30 tablet 0   sertraline (ZOLOFT) 100 MG tablet Take 1 tablet (100 mg total) by mouth daily. 90 tablet 3   sertraline (ZOLOFT) 50 MG tablet Take 1 tablet (50 mg total) by mouth daily. Add to the 100 mg tablet for a total dose of 150 mg 90 tablet 3   No current facility-administered medications for this visit.     ALLERGIES: Patient has no known  allergies.  Family History  Problem Relation Age of Onset   ALS Father     Social History   Socioeconomic History   Marital status: Married    Spouse name: Not on file   Number of children: Not on file   Years of education: Not on file   Highest education level: Not on file  Occupational History   Not on file  Tobacco Use   Smoking status: Some Days    Types: Pipe   Smokeless tobacco: Never  Vaping Use   Vaping Use: Never used  Substance and Sexual Activity   Alcohol use: No   Drug use: No   Sexual activity: Yes    Partners: Male    Birth control/protection: None  Other Topics Concern   Not on file  Social History Narrative   Not on file   Social Determinants of Health   Financial Resource Strain: Not on file  Food Insecurity: Not on file  Transportation Needs: Not on file  Physical Activity: Not on file  Stress: Not on file  Social Connections: Not on  file  Intimate Partner Violence: Not on file    Review of Systems  All other systems reviewed and are negative.  PHYSICAL EXAMINATION:    BP 120/82    Pulse 73    Ht 5\' 3"  (1.6 m)    Wt 144 lb (65.3 kg)    LMP 07/18/2021    SpO2 99%    BMI 25.51 kg/m     General appearance: alert, cooperative and appears stated age  80. Late menses Trying to get pregnant for 9 months - Pregnancy, urine: negative - B-HCG Quant -Recommended she start multivitamin, or at least start folic acid.  -If she isn't pregnant after 1 year of trying will evaluate

## 2021-09-12 NOTE — Patient Instructions (Signed)
Preparing for Pregnancy If you are planning to become pregnant, talk to your health care provider about preconception care. This type of care helps you prepare for a safe and healthy pregnancy. During this visit, your health care provider will: Do a complete physical exam, including a Pap test. Take your complete medical history. Give you information, answer your questions, and help you resolve problems. Preconception checklist Medical history Tell your health care provider about any medical conditions you have or have had. Your pregnancy or your ability to become pregnant may be affected by long-term (chronic) conditions, such as: Diabetes. High blood pressure (hypertension). Thyroid problems. Tell your health care provider about your family's medical history and your partner's medical history. Tell your health care provider if you have or have had any sexually transmitted infections, orSTIs. These can affect your pregnancy. In some cases, they can be passed to your baby. If needed, discuss the benefits of genetic testing. This test checks for conditions that may be passed from parent to child. Tell your health care provider about: Any problems you had getting pregnant or while pregnant. Any medicines you take. These include vitamins, herbal supplements, and over-the-counter medicines. Your history of getting vaccines. Discuss any vaccines that you may need. Diet Ask your health care provider about what foods to eat in order to get a balance of nutrients. This is especially important when you are pregnant or preparing to become pregnant. It is recommended that women of childbearing age take a folic acid supplement of 400 mcg daily and eat foods rich in folic acid to prevent certain birth defects. Ask your health care provider to help you reach a healthy weight before pregnancy. If you are overweight, you may have a higher risk for certain problems. These include hypertension, diabetes, and  early (preterm) birth. If you are underweight, you are more likely to have a baby who has a low birth weight. Lifestyle, work, and home Let your health care provider know about: Any lifestyle habits that you have, such as use of alcohol, drugs, or tobacco products. Fun and leisure activities that may put you at risk during pregnancy, such as downhill skiing and certain exercise programs. Any plans to travel out of the country, especially to places with an active [place] virus outbreak. Harmful substances that you may be exposed to at work or at home. These include chemicals, pesticides, radiation, and substances from cat litter boxes. Any concerns you have for your safety at home. Mental health Tell your health care provider about: Any history of mental health conditions, including feelings of depression, sadness, or anxiety. Any medicines that you take for a mental health condition. These include herbs and supplements. How do I know that I am pregnant? You may be pregnant if you have been sexually active and you miss your period. Other symptoms of early pregnancy include: Mild cramping. Very light vaginal bleeding (spotting). Feeling more tired than usual. Nausea and vomiting. These may be signs of morning sickness. Take a home pregnancy test if you have any of these symptoms. This test checks for a hormone in your urine called human chorionic gonadotropin, or hCG. A woman's body begins to make this hormone during early pregnancy. These tests are very accurate. Wait until at least the first day after you miss your period to take a home pregnancy test. If the test shows that you are pregnant, call your health care provider for a prenatal care visit. What should I do if I become pregnant? Schedule a  visit with your health care provider as soon as you suspect you are pregnant. Talk to your health care provider if you are taking prescription medicines to determine if they are safe to take during  pregnancy. You may continue to have sex if it does not cause pain or other problems, such as vaginal bleeding. Follow these instructions at home: Eating and drinking  Follow instructions from your health care provider about eating or drinking restrictions. Drink enough fluid to keep your urine pale yellow. Eat a balanced diet. This includes fresh fruits and vegetables, whole grains, lean meats, low-fat dairy products, healthy fats, and foods that are high in fiber. Ask to meet with a nutritionist or registered dietitian for help with meal planning and goals. Avoid eating raw or undercooked meat and seafood. Avoid eating or drinking unpasteurized dairy products. Lifestyle   Get regular exercise. Try to be active for at least 30 minutes a day on most days of the week. Ask your health care provider which activities are safe during pregnancy. Maintain a healthy weight. Avoid toxic fumes and chemicals. Avoid cleaning cat litter boxes. Cat feces may contain a harmful parasite called toxoplasma. Avoid travel to countries where [place] virus is common. Do not use any products that contain nicotine or tobacco, such as cigarettes, e-cigarettes, and chewing tobacco. If you need help quitting, ask your health care provider. Do not drink alcohol or use drugs. General instructions Keep an accurate record of your menstrual periods. This makes it easier for your health care provider to determine your baby's due date. Take over-the-counter and prescription medicines only as told by your health care provider. Begin taking prenatal vitamins and folic acid supplements daily as directed. Manage any chronic conditions, such as hypertension and diabetes, as told by your health care provider. This is important. Summary If you are planning to become pregnant, talk to your health care provider about preconception care. This is an important part of planning for a healthy pregnancy. Women of childbearing age should take  321 mcg of folic acid daily in addition to eating a diet rich in folic acid. This will prevent certain birth defects. Schedule a visit with your health care provider as soon as you suspect you are pregnant. Tell your health care provider about your medical history, lifestyle activities, home safety, and other things that may concern you. This information is not intended to replace advice given to you by your health care provider. Make sure you discuss any questions you have with your health care provider. Document Revised: 05/18/2019 Document Reviewed: 05/18/2019 Elsevier Patient Education  [place].

## 2021-09-13 ENCOUNTER — Telehealth: Payer: Self-pay | Admitting: *Deleted

## 2021-09-13 NOTE — Telephone Encounter (Signed)
Call to patient.  Left detailed message, ok per dpr.  Advised BetaHcG <3, not pregnant.   Return call to office if any additional questions.   Routing to Dr. Talbert Nan.   Encounter closed.

## 2021-10-16 ENCOUNTER — Ambulatory Visit: Payer: Medicaid Other | Admitting: Obstetrics and Gynecology

## 2021-10-16 ENCOUNTER — Encounter: Payer: Self-pay | Admitting: Obstetrics and Gynecology

## 2021-10-16 ENCOUNTER — Other Ambulatory Visit: Payer: Self-pay

## 2021-10-16 VITALS — BP 122/70 | HR 67 | Ht 63.0 in | Wt 143.2 lb

## 2021-10-16 DIAGNOSIS — N898 Other specified noninflammatory disorders of vagina: Secondary | ICD-10-CM | POA: Diagnosis not present

## 2021-10-16 DIAGNOSIS — R140 Abdominal distension (gaseous): Secondary | ICD-10-CM | POA: Diagnosis not present

## 2021-10-16 DIAGNOSIS — N941 Unspecified dyspareunia: Secondary | ICD-10-CM

## 2021-10-16 DIAGNOSIS — R109 Unspecified abdominal pain: Secondary | ICD-10-CM

## 2021-10-16 LAB — WET PREP FOR TRICH, YEAST, CLUE

## 2021-10-16 LAB — PREGNANCY, URINE: Preg Test, Ur: NEGATIVE

## 2021-10-16 NOTE — Progress Notes (Signed)
GYNECOLOGY  VISIT   HPI: 31 y.o.   Married Other or two or more races Not Hispanic or Latino  female   [id number] with Patient's last menstrual period was 10/06/2021.  LMP was normal.  here for bloating and cramping. The bloating has been going on for a couple of months, seems to be getting worse. The bloating is worse during the course of the day, more prominent on the left. Has a normal BM almost every day.  She feels daily menstrual like cramping since her last period. The cramping is constant, 3-6/10 in severity.  She c/o an increase in vaginal discharge and pain with intercourse.  The d/c is thin, no itching, burning, irritation or odor.  She has noticed deep dyspareunia a few times in the last few months.  Not having frequent intercourse, low libido. She went off of Sertraline (was on it for anxiety).   GYNECOLOGIC HISTORY: Patient's last menstrual period was 10/06/2021. 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 30 g 0   ibuprofen (ADVIL) 600 MG tablet Take 1 tablet (600 mg total) by mouth every 6 (six) hours. 30 tablet 0   sertraline (ZOLOFT) 100 MG tablet Take 1 tablet (100 mg total) by mouth daily. 90 tablet 3   sertraline (ZOLOFT) 50 MG tablet Take 1 tablet (50 mg total) by  mouth daily. Add to the 100 mg tablet for a total dose of 150 mg 90 tablet 3   No current facility-administered medications for this visit.     ALLERGIES: Patient has no known allergies.  Family History  Problem Relation Age of Onset   ALS Father     Social History   Socioeconomic History   Marital status: Married    Spouse name: Not on file   Number of children: Not on file   Years of education: Not on file   Highest education level: Not on file  Occupational History   Not on file  Tobacco Use   Smoking status: Some Days    Types: Pipe   Smokeless tobacco: Never  Vaping Use   Vaping Use: Never used  Substance and Sexual Activity   Alcohol use: No   Drug use: No   Sexual activity: Yes    Partners: Male    Birth control/protection: None  Other Topics Concern   Not on file  Social History Narrative   Not on file   Social Determinants of Health   Financial Resource Strain: Not on file  Food  Insecurity: Not on file  Transportation Needs: Not on file  Physical Activity: Not on file  Stress: Not on file  Social Connections: Not on file  Intimate Partner Violence: Not on file    Review of Systems  Gastrointestinal:        Bloating   Genitourinary:        Vaginal discharge  Pain with intercourse   All other systems reviewed and are negative.  PHYSICAL EXAMINATION:    BP 122/70    Pulse 67    Ht 5\' 3"  (1.6 m)    Wt 143 lb 3.2 oz (65 kg)    LMP 10/06/2021    SpO2 99%    BMI 25.37 kg/m     General appearance: alert, cooperative and appears stated age Abdomen: soft, non-tender; moderately distended, no masses,  no organomegaly  Pelvic: External genitalia:  no lesions              Urethra:  normal appearing urethra with no masses, tenderness or lesions              Bartholins and Skenes: normal                 Vagina: normal appearing vagina with a slight increase in watery, white vaginal d/c              Cervix: no cervical motion tenderness and no lesions               Bimanual Exam:  Uterus:  normal size, contour, position, consistency, mobility, non-tender and anteverted              Adnexa:  no masses, mildly tender on the right                Chaperone was present for exam.  1. Abdominal bloating Discussed common causes for bloating She will try gas-x Try eliminating dairy for a few weeks to see if that helps Discussed gluten as another common cause of bowel issues Discussed the option of a consultation with GI, she will reach out if she wants to do this  2. Abdominal cramping Suspect it is GI, but if it persists will order an ultrasound - Pregnancy, urine  3. Vaginal discharge - WET PREP FOR TRICH, YEAST, CLUE - [facility] Advanced Vaginitis, TMA  4. Dyspareunia, female Discussed causes of deep dyspareunia, no concerning findings on exam. She should control rate and depth of penetration If it persists she will let me know and I will set up an ultrasound

## 2021-10-16 NOTE — Patient Instructions (Signed)
Try gas x for bloating  Try eliminating dairy   Abdominal Bloating When you have abdominal bloating, your abdomen may feel full, tight, or painful. It may also look bigger than normal or swollen (distended). Common causes of abdominal bloating include: Swallowing air. Constipation. Problems digesting food. Eating too much. Irritable bowel syndrome. This is a condition that affects the large intestine. Lactose intolerance. This is an inability to digest lactose, a natural sugar in dairy products. Celiac disease. This is a condition that affects the ability to digest gluten, a protein found in some grains. Gastroparesis. This is a condition that slows down the movement of food in the stomach and small intestine. It is more common in people with diabetes mellitus. Gastroesophageal reflux disease (GERD). This is a condition that makes stomach acid flow back into the esophagus. Urinary retention. This means that the body is holding onto urine, and the bladder cannot be emptied all the way. Follow these instructions at home: Eating and drinking Avoid eating too much. Try not to swallow air while talking or eating. Avoid eating while lying down. Avoid these foods and drinks: Foods that cause gas, such as broccoli, cabbage, cauliflower, and baked beans. Carbonated drinks. Hard candy. Chewing gum. Medicines Take over-the-counter and prescription medicines only as told by your health care provider. Take probiotic medicines. These medicines contain live bacteria or yeasts that can help digestion. Take coated peppermint oil capsules. General instructions Try to exercise regularly. Exercise may help to relieve bloating that is caused by gas and relieve constipation. Keep all follow-up visits. This is important. Contact a health care provider if: You have nausea and vomiting. You have diarrhea. You have abdominal pain. You have unusual weight loss or weight gain. You have severe pain, and  medicines do not help. Get help right away if: You have chest pain. You have trouble breathing. You have shortness of breath. You have trouble urinating. You have darker urine than normal. You have blood in your stools or have dark, tarry stools. These symptoms may represent a serious problem that is an emergency. Do not wait to see if the symptoms will go away. Get medical help right away. Call your local emergency services (911 in the [place] Do not drive yourself to the hospital. Summary Abdominal bloating means that the abdomen is swollen. Common causes of abdominal bloating are swallowing air, constipation, and problems digesting food. Avoid eating too much and avoid swallowing air. Avoid foods that cause gas, carbonated drinks, hard candy, and chewing gum. This information is not intended to replace advice given to you by your health care provider. Make sure you discuss any questions you have with your health care provider. Document Revised: 03/20/2020 Document Reviewed: 03/20/2020 Elsevier Patient Education  [place].

## 2021-10-17 LAB — SURESWAB® ADVANCED VAGINITIS,TMA
CANDIDA SPECIES: NOT DETECTED
Candida glabrata: NOT DETECTED
SURESWAB(R) ADV BACTERIAL VAGINOSIS(BV),TMA: NEGATIVE
TRICHOMONAS VAGINALIS (TV),TMA: NOT DETECTED

## 2021-12-05 ENCOUNTER — Ambulatory Visit (INDEPENDENT_AMBULATORY_CARE_PROVIDER_SITE_OTHER): Payer: Medicaid Other | Admitting: Obstetrics and Gynecology

## 2021-12-05 ENCOUNTER — Encounter: Payer: Self-pay | Admitting: Obstetrics and Gynecology

## 2021-12-05 VITALS — BP 100/70 | HR 66 | Ht 63.0 in | Wt 141.0 lb

## 2021-12-05 DIAGNOSIS — R5383 Other fatigue: Secondary | ICD-10-CM

## 2021-12-05 DIAGNOSIS — N920 Excessive and frequent menstruation with regular cycle: Secondary | ICD-10-CM | POA: Diagnosis not present

## 2021-12-05 DIAGNOSIS — Z3169 Encounter for other general counseling and advice on procreation: Secondary | ICD-10-CM | POA: Diagnosis not present

## 2021-12-05 LAB — CBC
HCT: 42.1 % (ref 35.0–45.0)
Hemoglobin: 13.7 g/dL (ref 11.7–15.5)
MCH: 27.3 pg (ref 27.0–33.0)
MCHC: 32.5 g/dL (ref 32.0–36.0)
MCV: 83.9 fL (ref 80.0–100.0)
MPV: 10.9 fL (ref 7.5–12.5)
Platelets: 289 10*3/uL (ref 140–400)
RBC: 5.02 10*6/uL (ref 3.80–5.10)
RDW: 12.7 % (ref 11.0–15.0)
WBC: 5.7 10*3/uL (ref 3.8–10.8)

## 2021-12-05 LAB — FERRITIN: Ferritin: 10 ng/mL — ABNORMAL LOW (ref 16–154)

## 2021-12-05 LAB — TSH: TSH: 1.94 mIU/L

## 2021-12-05 NOTE — Patient Instructions (Signed)
Call on the first day of your cycle to set up cycle day #3 blood work. ? ?Hysterosalpingography ?Hysterosalpingography is a procedure to look inside a woman's womb (uterus) and fallopian tubes. During this procedure, contrast dye is injected into the uterus through the vagina and cervix. X-rays are then taken. The dye makes the uterus and fallopian tubes show up clearly on the X-rays and may show whether the tubes are blocked, scarred, or if there are any other abnormalities. ?This procedure may be done: ?To determine if there are tumors, scars, or other abnormalities in the uterus. ?To determine why a woman is unable to get pregnant or has had repeated pregnancy losses. ?To make sure the fallopian tubes are completely blocked a few months after having certain tubal sterilization procedures. ?Tell a health care provider about: ?Any allergies you have. ?All medicines you are taking, including vitamins, herbs, eye drops, creams, and over-the-counter medicines. ?Any problems you or family members have had with anesthetic medicines. ?Any blood disorders you have. ?Any surgeries you have had. ?Any medical conditions you have. ?Whether you are pregnant or may be pregnant. ?Whether you have been diagnosed with an STI (sexually transmitted infection) or you think you have an STI. ?What are the risks? ?Generally, this is a safe procedure. However, problems may occur, including: ?Infection in the lining of the uterus or fallopian tubes. ?Allergic reaction to medicines or dyes. ?A hole (perforation) in the uterus or fallopian tubes. ?Damage to other structures or organs. ?What happens before the procedure? ?Medicines ?Ask your health care provider about: ?Changing or stopping your regular medicines. This is especially important if you are taking diabetes medicines or blood thinners. ?Taking medicines such as aspirin and ibuprofen. These medicines can thin your blood. Do not take these medicines unless your health care provider  tells you to take them. ?Taking over-the-counter medicines, vitamins, herbs, and supplements. ?General instructions ?Schedule the procedure after your menstrual period stops, but before your next ovulation. This is usually between day 5 and day 10 of your last period. Day 1 is the first day of your period. ?Plan to have a responsible adult take you home from the hospital or clinic. ?Plan to have a responsible adult care for you for the time you are told after you leave the hospital or clinic. This is important. ?Empty your bladder before the procedure begins. ?What happens during the procedure? ?You may be given: ?A medicine to help you relax (sedative). ?A medicine to numb the area (local anesthetic). ?An over-the-counter pain medicine. ?You will lie down on your back and place your feet into footrests (stirrups). ?A device called a speculum will be inserted into your vagina. This allows your health care provider to see inside your vagina through to your cervix. ?Your cervix will be washed with a germ-killing soap. ?A medicine may be injected into your cervix to numb it. ?A thin, flexible tube will be passed through your cervix into your uterus. ?Contrast dye will be passed through the tube and into the uterus. This may cause cramping. ?Several X-rays will be taken as the contrast dye spreads through the uterus and into the fallopian tubes. ?You may be asked to your change position and roll from side to side if needed. ?The tube will be removed. The contrast dye will flow out through your vagina. ?The procedure may vary among health care providers and hospitals. ?What can I expect after procedure? ?You may have mild cramping and vaginal bleeding. This should go away after a  short time. ?Most of the contrast dye will flow out of your vagina. This fluid will be sticky and may have blood in it. You may want to wear a sanitary pad. Do not use a tampon. ?You have mild dizziness or nausea. ?Follow these instructions at  home: ?Do not douche, use tampons, or have sex until your health care provider approves. ?Do not take baths, swim, or use a hot tub until your health care provider approves. Take showers instead of baths for 2 weeks, or for as long as told by your health care provider. ?If you were given a sedative during the procedure, it can affect you for several hours. Do not drive or operate machinery until your health care provider says that it is safe. ?Take over-the-counter and prescription medicines only as told by your health care provider. ?It is up to you to get the results of your procedure. Ask your health care provider, or the department that is doing the procedure, when your results will be ready. ?Keep all follow-up visits. This is important. ?Contact a health care provider if: ?You have a fever or chills. ?You faint. ?You have severe cramping. ?Your skin has a rash, and is itchy or swollen. ?You have a bad-smelling vaginal discharge. ?You have vaginal bleeding that lasts for more than 4 days. ?Get help right away if: ?You have nausea and vomiting. ?You have heavy vaginal bleeding that soaks more than one pad every hour. ?You have severe abdominal pain. ?Summary ?Hysterosalpingography is a procedure in which a woman's uterus and fallopian tubes are examined. ?During this procedure, contrast dye is injected into the uterus through the vagina and cervix. X-rays are then taken. The dye helps the uterus and fallopian tubes show up clearly on the X-rays. ?Schedule the procedure after your menstrual period stops, but before your next ovulation. This is usually between day 5 and day 10 of your last period. ?After the procedure, you may have mild cramping and vaginal bleeding. This should go away after a short time. ?Do not douche, use tampons, or have sex until your health care provider approves. ?This information is not intended to replace advice given to you by your health care provider. Make sure you discuss any  questions you have with your health care provider. ?Document Revised: 05/01/2021 Document Reviewed: 04/04/2020 ?Elsevier Patient Education ? [place]. ? ?

## 2021-12-05 NOTE — Progress Notes (Signed)
GYNECOLOGY  VISIT ?  ?HPI: ?31 y.o.   Married Other or two or more races Not Hispanic or Latino  female   ?[id number] with Patient's last menstrual period was 11/27/2021.   ?here for a fertility consult. She states that they have been trying for a year.  ?She is on PNV.  ? ?Cycles have been q 25-27 days x 4 days. Saturates a pad in up to 30 minutes (no change). This heavy for years. Normal hgb and TSH last year. ?She is having ovulation with predictor kits, ovulating on day # 11. Careful to have sex prior to and with ovulation, thinks she has hit it very well for at least 6 out of 12 months.  ? ? ?U/S in 3/22 ?Impression:  ?Anteverted uterus with 2 small myomas (intramural and subserosal), possible adenomyosis. ?Normal symmetrical endometrium ?Normal ovaries bilaterally ?No free fluid ? ?No STD's. ? ?Husband is 66, healthy, he smokes tobacco, no marijuana, no ETOH.  ? ?She conceived her first child (with her current partner) on her first try.  ? ?She is currently cycle day 9.  ? ?GYNECOLOGIC HISTORY: ?Patient's last menstrual period was 11/27/2021. ?Contraception:none  ?Menopausal hormone therapy: none  ?       ?OB History   ? ? Gravida  ?2  ? Para  ?1  ? Term  ?1  ? Preterm  ?0  ? AB  ?1  ? Living  ?1  ?  ? ? SAB  ?0  ? IAB  ?0  ? Ectopic  ?0  ? Multiple  ?0  ? Live Births  ?1  ?   ?  ?  ?    ? ?Patient Active Problem List  ? Diagnosis Date Noted  ? Perineal laceration complicating delivery 05/39/7673  ? Depression affecting pregnancy in third trimester, antepartum 10/14/2018  ? ? ?Past Medical History:  ?Diagnosis Date  ? Allergy   ? Anxiety   ? ? ?Past Surgical History:  ?Procedure Laterality Date  ? tonsilect    ? TONSILLECTOMY    ? ? ?Current Outpatient Medications  ?Medication Sig Dispense Refill  ? betamethasone valerate ointment (VALISONE) 0.1 % Apply 1 application topically 2 (two) times daily. 30 g 0  ? ibuprofen (ADVIL) 600 MG tablet Take 1 tablet (600 mg total) by mouth every 6 (six) hours. 30 tablet 0  ?  sertraline (ZOLOFT) 100 MG tablet Take 1 tablet (100 mg total) by mouth daily. 90 tablet 3  ? sertraline (ZOLOFT) 50 MG tablet Take 1 tablet (50 mg total) by mouth daily. Add to the 100 mg tablet for a total dose of 150 mg 90 tablet 3  ? ?No current facility-administered medications for this visit.  ?  ? ?ALLERGIES: Patient has no known allergies. ? ?Family History  ?Problem Relation Age of Onset  ? ALS Father   ? ? ?Social History  ? ?Socioeconomic History  ? Marital status: Married  ?  Spouse name: Not on file  ? Number of children: Not on file  ? Years of education: Not on file  ? Highest education level: Not on file  ?Occupational History  ? Not on file  ?Tobacco Use  ? Smoking status: Some Days  ?  Types: Pipe  ? Smokeless tobacco: Never  ?Vaping Use  ? Vaping Use: Never used  ?Substance and Sexual Activity  ? Alcohol use: No  ? Drug use: No  ? Sexual activity: Yes  ?  Partners: Male  ?  Birth control/protection:  None  ?Other Topics Concern  ? Not on file  ?Social History Narrative  ? Not on file  ? ?Social Determinants of Health  ? ?Financial Resource Strain: Not on file  ?Food Insecurity: Not on file  ?Transportation Needs: Not on file  ?Physical Activity: Not on file  ?Stress: Not on file  ?Social Connections: Not on file  ?Intimate Partner Violence: Not on file  ? ? ?Review of Systems  ?All other systems reviewed and are negative. ? ?PHYSICAL EXAMINATION:   ? ?BP 100/70 (BP Location: Left Arm, Patient Position: Sitting, Cuff Size: Normal)   Pulse 66   Ht '5\' 3"'$  (1.6 m)   Wt 141 lb (64 kg)   LMP 11/27/2021   SpO2 100%   BMI 24.98 kg/m?     ?General appearance: alert, cooperative and appears stated age ? ?1. Encounter for infertility counseling ?- Follicle stimulating hormone; Future ?- Estradiol; Future ?-Semen analysis ?-If the above testing is normal, will set her up for a HSG (information given) ? ?2. Menorrhagia with regular cycle ?No change, prior u/s was normal other than a small intramural and  subserosal myoma ?- CBC ?- TSH ?- Ferritin ? ?3. Other fatigue ?- CBC ?- TSH ?- Ferritin ? ? ?

## 2021-12-26 ENCOUNTER — Telehealth: Payer: Self-pay

## 2021-12-26 DIAGNOSIS — Z3169 Encounter for other general counseling and advice on procreation: Secondary | ICD-10-CM

## 2021-12-26 NOTE — Telephone Encounter (Signed)
Placed order and sent pt mychart msg to notify her to call and schedule appt at [facility] imaging for tomorrow for this cycle.  ?

## 2021-12-26 NOTE — Telephone Encounter (Signed)
FYI. Scheduled for 01/02/22.  ?

## 2021-12-27 ENCOUNTER — Other Ambulatory Visit: Payer: Medicaid Other

## 2021-12-27 DIAGNOSIS — Z3169 Encounter for other general counseling and advice on procreation: Secondary | ICD-10-CM

## 2021-12-28 LAB — ESTRADIOL: Estradiol: 63 pg/mL

## 2021-12-28 LAB — FOLLICLE STIMULATING HORMONE: FSH: 8.3 m[IU]/mL

## 2022-01-02 ENCOUNTER — Ambulatory Visit
Admission: RE | Admit: 2022-01-02 | Discharge: 2022-01-02 | Disposition: A | Discharge status: Home / Self Care | Payer: Medicaid Other | Source: Ambulatory Visit | Attending: Obstetrics and Gynecology | Admitting: Obstetrics and Gynecology

## 2022-01-02 DIAGNOSIS — Z3169 Encounter for other general counseling and advice on procreation: Secondary | ICD-10-CM

## 2022-02-14 ENCOUNTER — Encounter: Payer: Self-pay | Admitting: Obstetrics and Gynecology

## 2022-02-14 NOTE — Telephone Encounter (Signed)
Dr.Jertson  Annual exam was scheduled for 03/18/22.

## 2022-02-14 NOTE — Telephone Encounter (Signed)
Appointments please reach out to patient to schedule annual exam.

## 2022-02-17 NOTE — Telephone Encounter (Signed)
Please reach out to the patient, she should have run out of her medication months ago. How much Zoloft was she taking when she stopped using it and how long has it been.  We should restart her at 50 mg a day and adjust up as needed.

## 2022-02-18 MED ORDER — SERTRALINE HCL 50 MG PO TABS: 50.0000 mg | ORAL_TABLET | Freq: Every day | ORAL | 1 refills | Status: DC

## 2022-03-11 NOTE — Progress Notes (Signed)
31 y.o. G1P1011 Married Other or two or more races Not Hispanic or Latino female here for annual exam.  Cycles are not as heavy as they were, going through a pad in 1-2 hours. Previously was saturating a pad in 30 minutes.  Period Cycle (Days): 28 Period Duration (Days): 4-5 Period Pattern: Regular Menstrual Flow: Moderate Menstrual Control: Maxi pad Menstrual Control Change Freq (Hours): 1-2 Dysmenorrhea: (!) Moderate Dysmenorrhea Symptoms: Cramping No dyspareunia.   H/O Infertility. Ovulating on predictor kits, normal HSG. Husband hasn't done a SA yet.   Patient's last menstrual period was 03/10/2022.          Sexually active: Yes.    The current method of family planning is none.    Exercising: Yes.     HIIT Smoker:  no  Health Maintenance: Pap:   09/07/20 negative; 04/15/18 Neg History of abnormal Pap:  no MMG:  none  BMD:   none  Colonoscopy: none TDaP:  10/14/18  Gardasil: never, counseled.     reports that she has been smoking pipe. She has never used smokeless tobacco. She reports that she does not drink alcohol and does not use drugs. Homemaker, son is 3.   Past Medical History:  Diagnosis Date   Allergy    Anxiety     Past Surgical History:  Procedure Laterality Date   tonsilect     TONSILLECTOMY      Current Outpatient Medications  Medication Sig Dispense Refill   sertraline (ZOLOFT) 50 MG tablet Take 1 tablet (50 mg total) by mouth daily. 30 tablet 1   betamethasone valerate ointment (VALISONE) 0.1 % Apply 1 application topically 2 (two) times daily. (Patient not taking: Reported on 03/18/2022) 30 g 0   ibuprofen (ADVIL) 600 MG tablet Take 1 tablet (600 mg total) by mouth every 6 (six) hours. (Patient not taking: Reported on 03/18/2022) 30 tablet 0   No current facility-administered medications for this visit.    Family History  Problem Relation Age of Onset   ALS Father     Review of Systems  All other systems reviewed and are negative.   Exam:    BP 110/60   Pulse 81   Ht '5\' 3"'$  (1.6 m)   Wt 142 lb (64.4 kg)   LMP 03/10/2022   SpO2 100%   BMI 25.15 kg/m   Weight change: '@WEIGHTCHANGE'$ @ Height:   Height: '5\' 3"'$  (160 cm)  Ht Readings from Last 3 Encounters:  03/18/22 '5\' 3"'$  (1.6 m)  12/05/21 '5\' 3"'$  (1.6 m)  10/16/21 '5\' 3"'$  (1.6 m)    General appearance: alert, cooperative and appears stated age Head: Normocephalic, without obvious abnormality, atraumatic Neck: no adenopathy, supple, symmetrical, trachea midline and thyroid normal to inspection and palpation Lungs: clear to auscultation bilaterally Cardiovascular: regular rate and rhythm Breasts: normal appearance, no masses or tenderness Abdomen: soft, non-tender; non distended,  no masses,  no organomegaly Extremities: extremities normal, atraumatic, no cyanosis or edema Skin: Skin color, texture, turgor normal. No rashes or lesions Lymph nodes: Cervical, supraclavicular, and axillary nodes normal. No abnormal inguinal nodes palpated Neurologic: Grossly normal   Pelvic: External genitalia:  no lesions              Urethra:  normal appearing urethra with no masses, tenderness or lesions              Bartholins and Skenes: normal                 Vagina: normal appearing  vagina with normal color and discharge, no lesions              Cervix: no lesions               Bimanual Exam:  Uterus:  normal size, contour, position, consistency, mobility, non-tender              Adnexa: no mass, fullness, tenderness               Rectovaginal: Confirms               Anus:  normal sphincter tone, no lesions  Gae Dry chaperoned for the exam.  1. Well woman exam Discussed breast self exam Discussed calcium and vit D intake No labs today No pap today  2. Immunization counseling Would like to start the series - HPV 9-valent vaccine,Recombinat  3. Anxiety Well controlled - sertraline (ZOLOFT) 50 MG tablet; Take 1 tablet (50 mg total) by mouth daily.  Dispense: 90 tablet;  Refill: 3  4. Infertility counseling She is ovulating on predictor kits, normal HSG Husband will do a SA # for fertility clinic given

## 2022-03-18 ENCOUNTER — Encounter: Payer: Self-pay | Admitting: Obstetrics and Gynecology

## 2022-03-18 ENCOUNTER — Ambulatory Visit (INDEPENDENT_AMBULATORY_CARE_PROVIDER_SITE_OTHER): Payer: Medicaid Other | Admitting: Obstetrics and Gynecology

## 2022-03-18 VITALS — BP 110/60 | HR 81 | Ht 63.0 in | Wt 142.0 lb

## 2022-03-18 DIAGNOSIS — Z23 Encounter for immunization: Secondary | ICD-10-CM | POA: Diagnosis not present

## 2022-03-18 DIAGNOSIS — F419 Anxiety disorder, unspecified: Secondary | ICD-10-CM

## 2022-03-18 DIAGNOSIS — Z01419 Encounter for gynecological examination (general) (routine) without abnormal findings: Secondary | ICD-10-CM

## 2022-03-18 DIAGNOSIS — Z7185 Encounter for immunization safety counseling: Secondary | ICD-10-CM | POA: Diagnosis not present

## 2022-03-18 DIAGNOSIS — Z3169 Encounter for other general counseling and advice on procreation: Secondary | ICD-10-CM

## 2022-03-18 MED ORDER — SERTRALINE HCL 50 MG PO TABS: 50.0000 mg | ORAL_TABLET | Freq: Every day | ORAL | 3 refills | Status: AC

## 2022-03-18 NOTE — Patient Instructions (Signed)

## 2022-04-09 ENCOUNTER — Encounter: Payer: Self-pay | Admitting: Obstetrics and Gynecology

## 2022-04-09 ENCOUNTER — Ambulatory Visit: Payer: Medicaid Other | Admitting: Obstetrics and Gynecology

## 2022-04-09 VITALS — BP 116/72

## 2022-04-09 DIAGNOSIS — N926 Irregular menstruation, unspecified: Secondary | ICD-10-CM

## 2022-04-09 DIAGNOSIS — Z8742 Personal history of other diseases of the female genital tract: Secondary | ICD-10-CM | POA: Diagnosis not present

## 2022-04-09 LAB — HCG, QUANTITATIVE, PREGNANCY: HCG, Total, QN: 7135 m[IU]/mL

## 2022-04-09 LAB — PREGNANCY, URINE: Preg Test, Ur: POSITIVE — AB

## 2022-04-09 NOTE — Progress Notes (Signed)
GYNECOLOGY  VISIT   HPI: 31 y.o.   Married Other or two or more races Not Hispanic or Latino  female   G33P1011 with Patient's last menstrual period was 03/10/2022.   here for positive pregnancy test.  She states that she is tired.  No bleeding, no pain. Very slight, intermittent cramps (less than menstrual cramps). She is on PNV.  H/O infertility. She had ovulation on predictor kits and a normal HSG.   GYNECOLOGIC HISTORY: Patient's last menstrual period was 03/10/2022. 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 (Patient not taking: Reported on 03/18/2022) 30 g 0   ibuprofen (ADVIL) 600 MG tablet Take 1 tablet (600 mg total) by mouth every 6 (six) hours. (Patient not taking: Reported on 03/18/2022) 30 tablet 0   sertraline (ZOLOFT) 50 MG tablet Take 1 tablet (50 mg total) by mouth daily. 90 tablet 3   No current facility-administered medications for this visit.     ALLERGIES: Patient has no known allergies.  Family History  Problem Relation Age of Onset   ALS Father     Social History   Socioeconomic History   Marital status: Married    Spouse name: Not on file   Number of children: Not on file   Years of education: Not on file   Highest education level: Not on file   Occupational History   Not on file  Tobacco Use   Smoking status: Some Days    Types: Pipe   Smokeless tobacco: Never  Vaping Use   Vaping Use: Never used  Substance and Sexual Activity   Alcohol use: No   Drug use: No   Sexual activity: Yes    Partners: Male    Birth control/protection: None  Other Topics Concern   Not on file  Social History Narrative   Not on file   Social Determinants of Health   Financial Resource Strain: Low Risk  (12/22/2018)   Overall Financial Resource Strain (CARDIA)    Difficulty of Paying Living Expenses: Not hard at all  Food Insecurity: No Food Insecurity (12/22/2018)   Hunger Vital Sign    Worried About Running Out of Food in the Last Year: Never true    [place] in the Last Year: Never true  Transportation Needs: Unknown (12/22/2018)   PRAPARE - Transportation    Lack of Transportation (Medical): No    Lack of Transportation (Non-Medical): Not on file  Physical Activity: Not on file  Stress: No Stress  Concern Present (12/22/2018)   [place]    Feeling of Stress : Not at all  Social Connections: Not on file  Intimate Partner Violence: Not At Risk (12/22/2018)   Humiliation, Afraid, Rape, and Kick questionnaire    Fear of Current or Ex-Partner: No    Emotionally Abused: No    Physically Abused: No    Sexually Abused: No    Review of Systems  All other systems reviewed and are negative.   PHYSICAL EXAMINATION:    BP 116/72   LMP 03/10/2022     General appearance: alert, cooperative and appears stated age  68. Missed menses - Pregnancy, urine: + - B-HCG Quant  2. History of infertility - B-HCG Quant -Call with pain or bleeding

## 2022-04-10 ENCOUNTER — Other Ambulatory Visit: Payer: Self-pay

## 2022-04-15 ENCOUNTER — Ambulatory Visit (INDEPENDENT_AMBULATORY_CARE_PROVIDER_SITE_OTHER): Payer: Medicaid Other

## 2022-04-15 DIAGNOSIS — Z3A01 Less than 8 weeks gestation of pregnancy: Secondary | ICD-10-CM | POA: Diagnosis not present

## 2022-04-15 DIAGNOSIS — Z3201 Encounter for pregnancy test, result positive: Secondary | ICD-10-CM

## 2022-04-15 DIAGNOSIS — Z8742 Personal history of other diseases of the female genital tract: Secondary | ICD-10-CM

## 2022-04-15 DIAGNOSIS — O3680X1 Pregnancy with inconclusive fetal viability, fetus 1: Secondary | ICD-10-CM

## 2022-04-15 DIAGNOSIS — N926 Irregular menstruation, unspecified: Secondary | ICD-10-CM

## 2022-04-19 ENCOUNTER — Telehealth: Payer: Self-pay | Admitting: Obstetrics and Gynecology

## 2022-04-19 NOTE — Telephone Encounter (Signed)
Mychart message sent.

## 2022-06-09 ENCOUNTER — Ambulatory Visit (INDEPENDENT_AMBULATORY_CARE_PROVIDER_SITE_OTHER): Payer: Medicaid Other

## 2022-06-09 DIAGNOSIS — Z3480 Encounter for supervision of other normal pregnancy, unspecified trimester: Secondary | ICD-10-CM | POA: Diagnosis not present

## 2022-06-09 NOTE — Progress Notes (Signed)
New OB Intake  I connected with  Emily Wilkins on 06/09/22 at  3:10 PM EDT by telephone Video Visit and verified that I am speaking with the correct person using two identifiers. Nurse is located at [facility] and pt is located at Home.  I discussed the limitations, risks, security and privacy concerns of performing an evaluation and management service by telephone and the availability of in person appointments. I also discussed with the patient that there may be a patient responsible charge related to this service. The patient expressed understanding and agreed to proceed.  I explained I am completing New OB Intake today. We discussed her EDD of 03/10/22 that is based on LMP of 12/15/22. Pt is G3/P1011. I reviewed her allergies, medications, Medical/Surgical/OB history, and appropriate screenings. I informed her of [facility] services. [facility] information placed in AVS. Based on history, this is a/an  pregnancy uncomplicated .   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 Patient given information for [facility] Healthy Baby website for more information about Women's and [place]. Patient is not interested in water birth. Offered upcoming OB visit with CNM to discuss further.  MyChart/Babyscripts MyChart access verified. I explained pt will have some visits in office and some virtually. Babyscripts instructions given and order placed. Patient verifies receipt of registration text/e-mail. Account successfully created and app downloaded.  Blood Pressure Cuff/Weight Scale Patient has access to BP cuff at home. Explained after first prenatal appt pt will check weekly and document in 24. Patient does have weight scale.   Anatomy US Explained first scheduled Korea will be around 19 weeks.  Anatomy US ordered. Date TBD.  Labs Discussed Johnsie Cancel genetic screening with patient. Would like both Panorama and Horizon drawn at new OB visit. Routine prenatal labs needed.  Covid Vaccine Patient has covid vaccine.   Is patient a CenteringPregnancy candidate?  Declined Declined due to Group Setting Not a candidate due to  Centering Patient" indicated on sticky note  Social Determinants of Health Food Insecurity: Patient denies food insecurity. WIC Referral: Patient is not interested in referral to [facility].  Transportation: Patient denies transportation needs. Childcare: Discussed no children allowed at ultrasound appointments. Offered childcare services; patient declines childcare services at this time.  First visit review I reviewed new OB appt with pt. I explained she will have a provider visit that includes prenatal labs, genetic screening, pap smear, std screening, and discuss plan of care for pregnancy. Explained pt will be seen by Dr. Currie Paris at first visit; encounter routed to appropriate provider. Explained that patient will be seen by pregnancy navigator following visit with provider.   Lucianne Lei, RN 06/09/2022  3:11 PM

## 2022-06-16 ENCOUNTER — Other Ambulatory Visit (HOSPITAL_COMMUNITY)
Admission: RE | Admit: 2022-06-16 | Discharge: 2022-06-16 | Disposition: A | Discharge status: Home / Self Care | Payer: Medicaid Other | Source: Ambulatory Visit | Attending: Obstetrics and Gynecology | Admitting: Obstetrics and Gynecology

## 2022-06-16 ENCOUNTER — Ambulatory Visit (INDEPENDENT_AMBULATORY_CARE_PROVIDER_SITE_OTHER): Payer: Medicaid Other | Admitting: Obstetrics and Gynecology

## 2022-06-16 VITALS — BP 104/67 | HR 80 | Wt 146.0 lb

## 2022-06-16 DIAGNOSIS — Z23 Encounter for immunization: Secondary | ICD-10-CM

## 2022-06-16 DIAGNOSIS — Z3480 Encounter for supervision of other normal pregnancy, unspecified trimester: Secondary | ICD-10-CM

## 2022-06-16 DIAGNOSIS — Z3A14 14 weeks gestation of pregnancy: Secondary | ICD-10-CM | POA: Insufficient documentation

## 2022-06-16 DIAGNOSIS — O26892 Other specified pregnancy related conditions, second trimester: Secondary | ICD-10-CM

## 2022-06-16 DIAGNOSIS — N898 Other specified noninflammatory disorders of vagina: Secondary | ICD-10-CM

## 2022-06-16 NOTE — Progress Notes (Signed)
Subjective:  Emily Wilkins is a G3P1011 17w0dbeing seen today for her first obstetrical visit.  Her obstetrical history is significant for  prior vaginal delivery with 2nd degree lac . Patient does intend to breast feed. Pregnancy history fully reviewed.  Patient reports  spotting x1 day, intermittent cramping, and increased vaginal discharge .  BP 104/67   Pulse 80   Wt 146 lb (66.2 kg)   LMP 03/10/2022   BMI 25.86 kg/m   HISTORY: OB History  Gravida Para Term Preterm AB Living  '3 1 1 '$ 0 1 1  SAB IAB Ectopic Multiple Live Births  0 0 0 0 1    # Outcome Date GA Lbr Len/2nd Weight Sex Delivery Anes PTL Lv  3 Current           2 Term 12/28/18 47w4d3:51 / 00:53 7 lb 3.7 oz (3.28 kg) M Vag-Spont EPI  LIV     Birth Comments: WNL  1 AB 2019 6w85w0d        Past Medical History:  Diagnosis Date   Allergy    Anxiety     Past Surgical History:  Procedure Laterality Date   tonsilect     TONSILLECTOMY      Family History  Problem Relation Age of Onset   ALS Father      Exam  BP 104/67   Pulse 80   Wt 146 lb (66.2 kg)   LMP 03/10/2022   BMI 25.86 kg/m   Chaperone present during exam  CONSTITUTIONAL: Well-developed, well-nourished female in no acute distress.  HENT:  Normocephalic, atraumatic, External right and left ear normal. Oropharynx is clear and moist EYES: Conjunctivae and EOM are normal. Pupils are equal, round, and reactive to light. No scleral icterus.  CARDIOVASCULAR: Normal heart rate noted, regular rhythm RESPIRATORY: Clear to auscultation bilaterally. Effort and breath sounds normal, no problems with respiration noted. BREASTS: deferred ABDOMEN: Soft, normal bowel sounds, no distention noted.  No tenderness, rebound or guarding.  PELVIC: Normal appearing external genitalia; normal appearing vaginal mucosa and cervix. No abnormal discharge noted. Expected uterine size, no other palpable masses, no uterine or adnexal tenderness. Cervix normal  appearing, no lesions or masses, small volume discharge noted MUSCULOSKELETAL: Normal range of motion. No tenderness.  No cyanosis, clubbing, or edema.  2+ distal pulses. SKIN: Skin is warm and dry. No rash noted. Not diaphoretic. No erythema. No pallor. NEUROLOGIC: Alert and oriented to person, place, and time. No cranial nerve deficit noted. PSYCHIATRIC: Normal mood and affect. Normal behavior. Normal judgment and thought content.    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 Supervision of other normal pregnancy, antepartum Discussed routine obstetrical expectations.  [facility]. Directed to PCP for RSV and covid vaccinations, flu vaccine completed today  - Cervicovaginal ancillary only( [place]) - PANORAMA PRENATAL TEST FULL PANEL - CBC/D/Plt+RPR+Rh+ABO+RubIgG... - Culture, OB Urine - HgB A1c - US KoreaM OB COMP + 14 WK; Future - Flu Vaccine QUAD 36+ mos IM (Fluarix, Quad PF)  2. Perineal laceration complicating delivery Intends vaginal delivery again  3. [redacted] weeks gestation of pregnancy Initial OB labs collected - Cervicovaginal ancillary only( [place]) - PANORAMA PRENATAL TEST FULL PANEL - CBC/D/Plt+RPR+Rh+ABO+RubIgG... -  Culture, OB Urine - HgB A1c - [place] MFM OB COMP + 14 WK; Future - Flu Vaccine QUAD 36+ mos IM (Fluarix, Quad PF)   4. Vaginal discharge in pregnancy Vaginitis swab and urine culture completed  Initial labs obtained Continue prenatal vitamins Reviewed n/v relief measures and warning s/s to report Reviewed recommended weight gain based on pre-gravid BMI Encouraged well-balanced diet Genetic & carrier screening discussed: requests Panorama, requests Horizon  Ultrasound discussed;  fetal survey: ordered [employer] completed> form faxed if has or is planning to apply for medicaid The nature of Protection for [employer] with multiple MDs and other Advanced Practice Providers was explained to patient; also emphasized that fellows, residents, and students are part of our team.  Problem list reviewed and updated.   Darliss Cheney, MD 06/16/2022

## 2022-06-16 NOTE — Progress Notes (Signed)
NOB, reports no problems today.

## 2022-06-17 ENCOUNTER — Other Ambulatory Visit: Payer: Self-pay | Admitting: Obstetrics and Gynecology

## 2022-06-17 ENCOUNTER — Ambulatory Visit (INDEPENDENT_AMBULATORY_CARE_PROVIDER_SITE_OTHER): Payer: Medicaid Other | Admitting: Licensed Clinical Social Worker

## 2022-06-17 DIAGNOSIS — B379 Candidiasis, unspecified: Secondary | ICD-10-CM

## 2022-06-17 DIAGNOSIS — Z8659 Personal history of other mental and behavioral disorders: Secondary | ICD-10-CM

## 2022-06-17 DIAGNOSIS — N898 Other specified noninflammatory disorders of vagina: Secondary | ICD-10-CM

## 2022-06-17 LAB — CBC/D/PLT+RPR+RH+ABO+RUBIGG...
Antibody Screen: NEGATIVE
Basophils Absolute: 0 10*3/uL (ref 0.0–0.2)
Basos: 0 %
EOS (ABSOLUTE): 0 10*3/uL (ref 0.0–0.4)
Eos: 1 %
HCV Ab: NONREACTIVE
HIV Screen 4th Generation wRfx: NONREACTIVE
Hematocrit: 37.6 % (ref 34.0–46.6)
Hemoglobin: 12.7 g/dL (ref 11.1–15.9)
Hepatitis B Surface Ag: NEGATIVE
Immature Grans (Abs): 0 10*3/uL (ref 0.0–0.1)
Immature Granulocytes: 0 %
Lymphocytes Absolute: 1.6 10*3/uL (ref 0.7–3.1)
Lymphs: 22 %
MCH: 28.2 pg (ref 26.6–33.0)
MCHC: 33.8 g/dL (ref 31.5–35.7)
MCV: 84 fL (ref 79–97)
Monocytes Absolute: 0.3 10*3/uL (ref 0.1–0.9)
Monocytes: 4 %
Neutrophils Absolute: 5.3 10*3/uL (ref 1.4–7.0)
Neutrophils: 73 %
Platelets: 264 10*3/uL (ref 150–450)
RBC: 4.5 x10E6/uL (ref 3.77–5.28)
RDW: 13.1 % (ref 11.7–15.4)
RPR Ser Ql: NONREACTIVE
Rh Factor: POSITIVE
Rubella Antibodies, IGG: 3.56 index (ref >0.99)
WBC: 7.2 10*3/uL (ref 3.4–10.8)

## 2022-06-17 LAB — CERVICOVAGINAL ANCILLARY ONLY
Bacterial Vaginitis (gardnerella): NEGATIVE
Candida Glabrata: POSITIVE — AB
Candida Vaginitis: NEGATIVE
Chlamydia: NEGATIVE
Comment: NEGATIVE
Comment: NEGATIVE
Comment: NEGATIVE
Comment: NEGATIVE
Comment: NEGATIVE
Comment: NORMAL
Neisseria Gonorrhea: NEGATIVE
Trichomonas: NEGATIVE

## 2022-06-17 LAB — HEMOGLOBIN A1C
Est. average glucose Bld gHb Est-mCnc: 100 mg/dL
Hgb A1c MFr Bld: 5.1 % (ref 4.8–5.6)

## 2022-06-17 LAB — HCV INTERPRETATION

## 2022-06-17 MED ORDER — NYSTATIN 100000 UNIT/GM EX CREA: TOPICAL_CREAM | CUTANEOUS | 0 refills | Status: DC

## 2022-06-18 LAB — CULTURE, OB URINE

## 2022-06-18 LAB — URINE CULTURE, OB REFLEX: Organism ID, Bacteria: NO GROWTH

## 2022-06-18 NOTE — BH Specialist Note (Signed)
Integrated Behavioral Health via Telemedicine Visit  06/18/2022 Aimar Shrewsbury [id number]  Number of Integrated Behavioral Health Clinician visits: 1 Session Start time:  1:00pm Session End time: 1:15pm Total time in minutes: 15 mins via phone   Referring Provider: Dr. Currie Paris  Patient/Family location: Home  [facility] Provider location: [place]  All persons participating in visit: Pt L Nubulsi and LCSW A. Linton Rump  Types of Service: [place] (BHI)  I connected with Sienna Arps and/or Rashunda Moisan's n/a via  Telephone or Video Enabled Telemedicine Application  (Video is Caregility application) and verified that I am speaking with the correct person using two identifiers. Discussed confidentiality: Yes   I discussed the limitations of telemedicine and the availability of in person appointments.  Discussed there is a possibility of technology failure and discussed alternative modes of communication if that failure occurs.  I discussed that engaging in this telemedicine visit, they consent to the provision of behavioral healthcare and the services will be billed under their insurance.  Patient and/or legal guardian expressed understanding and consented to Telemedicine visit: Yes   LCSW A. Kunaal Walkins and Pt Jason Vandezande completed new ob introduction. Ms. Basques is aware of practice offering and services. Ms. Leathers reports she does  have a mental health history.    Lynnea Ferrier, LCSW

## 2022-06-21 LAB — PANORAMA PRENATAL TEST FULL PANEL:PANORAMA TEST PLUS 5 ADDITIONAL MICRODELETIONS: FETAL FRACTION: 6.6

## 2022-06-28 ENCOUNTER — Other Ambulatory Visit: Payer: Self-pay | Admitting: Obstetrics and Gynecology

## 2022-06-28 DIAGNOSIS — N898 Other specified noninflammatory disorders of vagina: Secondary | ICD-10-CM

## 2022-06-28 DIAGNOSIS — B379 Candidiasis, unspecified: Secondary | ICD-10-CM

## 2022-06-30 ENCOUNTER — Other Ambulatory Visit: Payer: Self-pay | Admitting: *Deleted

## 2022-06-30 MED ORDER — NYSTATIN 100000 UNIT/GM EX CREA: TOPICAL_CREAM | CUTANEOUS | 0 refills | Status: DC

## 2022-06-30 NOTE — Telephone Encounter (Signed)
Pt reports original RX never filled, "delayed" at pharmacy

## 2022-07-14 ENCOUNTER — Ambulatory Visit (INDEPENDENT_AMBULATORY_CARE_PROVIDER_SITE_OTHER): Payer: Medicaid Other | Admitting: Advanced Practice Midwife

## 2022-07-14 VITALS — BP 102/67 | HR 72 | Wt 148.0 lb

## 2022-07-14 DIAGNOSIS — Z3480 Encounter for supervision of other normal pregnancy, unspecified trimester: Secondary | ICD-10-CM

## 2022-07-14 DIAGNOSIS — Z3A18 18 weeks gestation of pregnancy: Secondary | ICD-10-CM

## 2022-07-14 DIAGNOSIS — Z3482 Encounter for supervision of other normal pregnancy, second trimester: Secondary | ICD-10-CM

## 2022-07-14 NOTE — Progress Notes (Signed)
Patient presents for ROB. Patient has no concerns today. 

## 2022-07-14 NOTE — Progress Notes (Signed)
   PRENATAL VISIT NOTE  Subjective:  Emily Wilkins is a 31 y.o. G3P1011 at 15w0dbeing seen today for ongoing prenatal care.  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  Patient reports no complaints.  Contractions: Irritability. Vag. Bleeding: None.  Movement: Present. Denies leaking of fluid.   The following portions of the patient's history were reviewed and updated as appropriate: allergies, current medications, past family history, past medical history, past social history, past surgical history and problem list.   Objective:   Vitals:   07/14/22 1035  BP: 102/67  Pulse: 72  Weight: 148 lb (67.1 kg)    Fetal Status: Fetal Heart Rate (bpm): 140   Movement: Present     General:  Alert, oriented and cooperative. Patient is in no acute distress.  Skin: Skin is warm and dry. No rash noted.   Cardiovascular: Normal heart rate noted  Respiratory: Normal respiratory effort, no problems with respiration noted  Abdomen: Soft, gravid, appropriate for gestational age.  Pain/Pressure: Absent     Pelvic: Cervical exam deferred        Extremities: Normal range of motion.  Edema: None  Mental Status: Normal mood and affect. Normal behavior. Normal judgment and thought content.   Assessment and Plan:  Pregnancy: G3P1011 at 196w0d. Supervision of other normal pregnancy, antepartum --Anticipatory guidance about next visits/weeks of pregnancy given.  --Questions answered about exercise in pregnancy --FH slightly below umbilicus, wnl  - AFP, Serum, Open Spina Bifida  2. [redacted] weeks gestation of pregnancy   Preterm labor symptoms and general obstetric precautions including but not limited to vaginal bleeding, contractions, leaking of fluid and fetal movement were  reviewed in detail with the patient. Please refer to After Visit Summary for other counseling recommendations.   No follow-ups on file.  Future Appointments  Date Time Provider DeLeon[place]11/20/2023  9:30 AM [facility] US3 [facility] W[place]ost SpringsCNM

## 2022-07-15 ENCOUNTER — Other Ambulatory Visit: Payer: Self-pay | Admitting: *Deleted

## 2022-07-15 ENCOUNTER — Encounter: Payer: Self-pay | Admitting: Advanced Practice Midwife

## 2022-07-15 DIAGNOSIS — B379 Candidiasis, unspecified: Secondary | ICD-10-CM

## 2022-07-15 DIAGNOSIS — N898 Other specified noninflammatory disorders of vagina: Secondary | ICD-10-CM

## 2022-07-15 MED ORDER — NYSTATIN 100000 UNIT/GM EX CREA: TOPICAL_CREAM | CUTANEOUS | 0 refills | Status: AC

## 2022-07-15 NOTE — Progress Notes (Signed)
Nystatin RX reordered per pt request. Reports never filled and has symptoms of yeast infection.

## 2022-07-16 ENCOUNTER — Encounter: Payer: Self-pay | Admitting: Obstetrics and Gynecology

## 2022-07-16 LAB — AFP, SERUM, OPEN SPINA BIFIDA
AFP MoM: 1.54
AFP Value: 68.6 ng/mL
Gest. Age on Collection Date: 18 weeks
Maternal Age At EDD: 29.8 yr
OSBR Risk 1 IN: 2457
Test Results:: NEGATIVE
Weight: 148 [lb_av]

## 2022-07-17 ENCOUNTER — Other Ambulatory Visit: Payer: Self-pay | Admitting: Emergency Medicine

## 2022-07-17 MED ORDER — TERCONAZOLE 0.8 % VA CREA: 1.0000 | TOPICAL_CREAM | Freq: Every day | VAGINAL | 0 refills | Status: DC

## 2022-07-17 NOTE — Progress Notes (Signed)
Switching medication to Boric Acid '600mg'$  per Currie Paris, MD. Pt informed, states she will purchase OTC.

## 2022-07-17 NOTE — Progress Notes (Signed)
Rx for Yeast, per protocol.

## 2022-07-21 ENCOUNTER — Other Ambulatory Visit: Payer: Self-pay | Admitting: *Deleted

## 2022-07-21 ENCOUNTER — Ambulatory Visit: Payer: Medicaid Other | Attending: Obstetrics and Gynecology

## 2022-07-21 DIAGNOSIS — O321XX0 Maternal care for breech presentation, not applicable or unspecified: Secondary | ICD-10-CM | POA: Insufficient documentation

## 2022-07-21 DIAGNOSIS — Z3480 Encounter for supervision of other normal pregnancy, unspecified trimester: Secondary | ICD-10-CM | POA: Insufficient documentation

## 2022-07-21 DIAGNOSIS — Z3A19 19 weeks gestation of pregnancy: Secondary | ICD-10-CM | POA: Insufficient documentation

## 2022-07-21 DIAGNOSIS — Z3689 Encounter for other specified antenatal screening: Secondary | ICD-10-CM

## 2022-07-21 DIAGNOSIS — Z363 Encounter for antenatal screening for malformations: Secondary | ICD-10-CM | POA: Diagnosis present

## 2022-07-21 DIAGNOSIS — Z3A14 14 weeks gestation of pregnancy: Secondary | ICD-10-CM | POA: Insufficient documentation

## 2022-08-11 ENCOUNTER — Ambulatory Visit (INDEPENDENT_AMBULATORY_CARE_PROVIDER_SITE_OTHER): Payer: Medicaid Other | Admitting: Student

## 2022-08-11 VITALS — BP 110/70 | HR 71 | Wt 153.6 lb

## 2022-08-11 DIAGNOSIS — Z3482 Encounter for supervision of other normal pregnancy, second trimester: Secondary | ICD-10-CM

## 2022-08-11 DIAGNOSIS — Z3A22 22 weeks gestation of pregnancy: Secondary | ICD-10-CM

## 2022-08-11 DIAGNOSIS — Z3480 Encounter for supervision of other normal pregnancy, unspecified trimester: Secondary | ICD-10-CM

## 2022-08-11 NOTE — Progress Notes (Signed)
   PRENATAL VISIT NOTE  Subjective:  Emily Wilkins is a 31 y.o. G3P1011 at 62w3dbeing seen today for ongoing prenatal care.  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  Patient reports no complaints.  Contractions: Not present. Vag. Bleeding: None.  Movement: Present. Denies leaking of fluid.   The following portions of the patient's history were reviewed and updated as appropriate: allergies, current medications, past family history, past medical history, past social history, past surgical history and problem list.   Objective:   Vitals:   08/11/22 1053  BP: 110/70  Pulse: 71  Weight: 153 lb 9.6 oz (69.7 kg)    Fetal Status:     Movement: Present     General:  Alert, oriented and cooperative. Patient is in no acute distress.  Skin: Skin is warm and dry. No rash noted.   Cardiovascular: Normal heart rate noted  Respiratory: Normal respiratory effort, no problems with respiration noted  Abdomen: Soft, gravid, appropriate for gestational age.  Pain/Pressure: Absent     Pelvic: Cervical exam deferred        Extremities: Normal range of motion.  Edema: None  Mental Status: Normal mood and affect. Normal behavior. Normal judgment and thought content.   Assessment and Plan:  Pregnancy: G3P1011 at 259w3d. Supervision of other normal pregnancy, antepartum - Normal exam, vigorous fetal movement - Encouraged well-balanced diet  2. [redacted] weeks gestation of pregnancy - Follow- up next month, routine  Preterm labor symptoms and general obstetric precautions including but not limited to vaginal bleeding, contractions, leaking of fluid and fetal movement were reviewed in detail with the patient. Please refer to After Visit Summary for other counseling recommendations.    Return in about 4 weeks (around 09/08/2022) for LOB, IN-PERSON.  Future Appointments  Date Time Provider De[place]1/04/2023 10:35 AM LeElvera MariaCNM [facility] None  09/15/2022 10:30 AM [facility]/2024 10:45 AM [facility] US4 [facility]    NiJohnston EbbsNP

## 2022-08-11 NOTE — Progress Notes (Signed)
Patient presents for ROB. Patient has no concerns today. 

## 2022-08-16 ENCOUNTER — Encounter (HOSPITAL_BASED_OUTPATIENT_CLINIC_OR_DEPARTMENT_OTHER): Payer: Self-pay | Admitting: Emergency Medicine

## 2022-08-16 ENCOUNTER — Emergency Department (HOSPITAL_BASED_OUTPATIENT_CLINIC_OR_DEPARTMENT_OTHER): Payer: Medicaid Other

## 2022-08-16 ENCOUNTER — Emergency Department (HOSPITAL_BASED_OUTPATIENT_CLINIC_OR_DEPARTMENT_OTHER)
Admission: EM | Admit: 2022-08-16 | Discharge: 2022-08-16 | Disposition: A | Discharge status: Home / Self Care | Payer: Medicaid Other | Attending: Emergency Medicine | Admitting: Emergency Medicine

## 2022-08-16 ENCOUNTER — Other Ambulatory Visit: Payer: Self-pay

## 2022-08-16 DIAGNOSIS — R1032 Left lower quadrant pain: Secondary | ICD-10-CM | POA: Diagnosis not present

## 2022-08-16 DIAGNOSIS — O26892 Other specified pregnancy related conditions, second trimester: Secondary | ICD-10-CM | POA: Diagnosis present

## 2022-08-16 DIAGNOSIS — N898 Other specified noninflammatory disorders of vagina: Secondary | ICD-10-CM | POA: Diagnosis not present

## 2022-08-16 DIAGNOSIS — Z3A23 23 weeks gestation of pregnancy: Secondary | ICD-10-CM | POA: Insufficient documentation

## 2022-08-16 DIAGNOSIS — N949 Unspecified condition associated with female genital organs and menstrual cycle: Secondary | ICD-10-CM

## 2022-08-16 LAB — WET PREP, GENITAL
Clue Cells Wet Prep HPF POC: NONE SEEN
Sperm: NONE SEEN
Trich, Wet Prep: NONE SEEN
WBC, Wet Prep HPF POC: >=10 — AB (ref <10)

## 2022-08-16 LAB — URINALYSIS, ROUTINE W REFLEX MICROSCOPIC
Bilirubin Urine: NEGATIVE
Glucose, UA: NEGATIVE mg/dL
Hgb urine dipstick: NEGATIVE
Ketones, ur: NEGATIVE mg/dL
Leukocytes,Ua: NEGATIVE
Nitrite: NEGATIVE
Protein, ur: NEGATIVE mg/dL
Specific Gravity, Urine: 1.015 (ref 1.005–1.030)
pH: 6.5 (ref 5.0–8.0)

## 2022-08-16 MED ORDER — CLOTRIMAZOLE 1 % VA CREA: 1.0000 | TOPICAL_CREAM | Freq: Every day | VAGINAL | 0 refills | Status: AC

## 2022-08-16 NOTE — Discharge Instructions (Addendum)
Thank you for coming to [facility] Emergency Department. You were seen for abdominal pain. We did an exam, labs, and imaging, and these showed no acute findings. This is likely round ligament pain. Your OB ultrasound was very normal. We have also prescribed clotrimazole intravaginal ointment to apply for your yeast infection for 7 days daily at bedtime. Please follow up with your obstetrician within 1 week.   Do not hesitate to return to the [facility] [place] [facility] or call 911 if you experience: -Worsening symptoms -Vaginal bleeding -Gush of vaginal fluid -Lightheadedness, passing out -Fevers/chills -Anything else that concerns you

## 2022-08-16 NOTE — ED Provider Notes (Signed)
3:05 PM Assumed care of patient from off-going team. For more details, please see note from same day.  In brief, this is a 31 y.o. female at 66w1dw/ LLQ pain, radiation into leg. Pelvic exam w/ swabs sent, UA Pending.  Plan/Dispo at time of sign-out & ED Course since sign-out: '[ ]'$  OB [place] BP 113/81 (BP Location: Left Arm)   Pulse 73   Temp 98.2 F (36.8 C) (Oral)   Resp 18   Ht '5\' 3"'$  (1.6 m)   Wt 68.9 kg   LMP 03/10/2022   SpO2 99%   BMI 26.93 kg/m    ED Course:   Clinical Course as of 08/16/22 1616  Sat Aug 16, 2022  1604 UK[place]OB Limited FINDINGS: Number of Fetuses: 1  Heart Rate:  130 bpm  Movement: Yes  Presentation: Breech  Placental Location: Posterior  Previa: No  Amniotic Fluid (Subjective):  Within normal limits.  BPD: 5.91 cm 24 w  1 d  MATERNAL FINDINGS:  Cervix:  Appears closed.  Uterus/Adnexae: No abnormality visualized.  IMPRESSION: Single viable intrauterine pregnancy as above. No specific abnormality is seen.   [HN]  1605 Yeast Wet Prep HPF POC(!): PRESENT Patient states that she has recently been treated for a vaginal yeast infection by her OB with intravaginal clotrimazole cream x 3 days.  She states that she is still having excessive vaginal discharge and does not feel like it worked very well.  Will prescribe patient intravaginal clotrimazole again x 7 days for full course of treatment.  Patient is very relieved that her ultrasound was normal and is advised as needed and if symptoms worsen [facility]  Patient Dors understanding.  All questions answered to patient's faction.  Discharged with discharge directions, return precautions, intravaginal clotrimazole Rx. [HN]    Clinical Course User Index [HN] NAudley Hose MD   ------------------------------- HCindee Lame MD Emergency Medicine  This note was created using dictation software, which may contain spelling or grammatical  errors.   NAudley Hose MD 08/16/22 [phone]

## 2022-08-16 NOTE — ED Triage Notes (Signed)
Pt arrives pov, steady gait, c/o LLQ pain radiating to LT leg and LT flank X 3 days. Denies n/v, denies vaginal leakage or bleeding. Pt reports [redacted] weeks pregnant. G2p1. OB is [facility]. EDD 12/11/2022. Endorses fetal movement

## 2022-08-16 NOTE — ED Notes (Signed)
D/c paperwork reviewed with pt, including prescription. No questions or concerns at time of d/c. Pt ambulatory to ED exit without assistance, NAD.

## 2022-08-16 NOTE — ED Notes (Signed)
Pt ambulatory to bathroom, no assistance needed.  

## 2022-08-16 NOTE — ED Provider Notes (Signed)
Emergency Department Provider Note   I have reviewed the triage vital signs and the nursing notes.   HISTORY  Chief Complaint Abdominal Pain   HPI Emily Wilkins is a 31 y.o. female G27P1011 [facility] to the emergency department for evaluation of left-sided abdominal pain.  Symptoms have been progressively worsening over the past 3 days.  She is not having any vaginal bleeding or drainage of clear fluid.  She is having some mild increased discharge.  No dysuria, hesitancy, urgency.  She is continuing to feel baby move.  No chest pain or shortness of breath.  Pain seems to radiate into the left upper thigh, laterally.  No injury.   Past Medical History:  Diagnosis Date   Allergy    Anxiety     Review of Systems  Constitutional: No fever/chills Cardiovascular: Denies chest pain. Respiratory: Denies shortness of breath. Gastrointestinal: Positive LLQ abdominal pain.  No nausea, no vomiting.  No diarrhea.  No constipation. Genitourinary: Negative for dysuria. Musculoskeletal: Negative for back pain. Positive left leg pain.  Skin: Negative for rash. Neurological: Negative for headaches. ____________________________________________   PHYSICAL EXAM:  VITAL SIGNS: ED Triage Vitals  Enc Vitals Group     BP 08/16/22 1234 113/81     Pulse Rate 08/16/22 1234 73     Resp 08/16/22 1234 18     Temp 08/16/22 1234 98.2 F (36.8 C)     Temp Source 08/16/22 1234 Oral     SpO2 08/16/22 1234 99 %     Weight 08/16/22 1232 152 lb (68.9 kg)     Height 08/16/22 1232 '5\' 3"'$  (1.6 m)   Constitutional: Alert and oriented. Well appearing and in no acute distress. Eyes: Conjunctivae are normal.  Head: Atraumatic. Nose: No congestion/rhinnorhea. Mouth/Throat: Mucous membranes are moist.   Neck: No stridor.   Cardiovascular: Normal rate, regular rhythm. Good peripheral circulation. Grossly normal heart sounds.   Respiratory: Normal respiratory effort.  No  retractions. Lungs CTAB. Gastrointestinal: Soft and nontender. Abdomen is gravid.  Genitourinary: Exam performed with patient's verbal consent and nurse chaperone.  Normal external genitalia.  Physiologic discharge without vaginal bleeding or pooling fluid in the vault.  Cervix is closed.  Musculoskeletal: No lower extremity tenderness nor edema. No gross deformities of extremities. Neurologic:  Normal speech and language. No gross focal neurologic deficits are appreciated.  Skin:  Skin is warm, dry and intact. No rash noted.  ____________________________________________   LABS (all labs ordered are listed, but only abnormal results are displayed)  Labs Reviewed  WET PREP, GENITAL - Abnormal; Notable for the following components:      Result Value   Yeast Wet Prep HPF POC PRESENT (*)    WBC, Wet Prep HPF POC >=10 (*)    All other components within normal limits  URINE CULTURE  URINALYSIS, ROUTINE W REFLEX MICROSCOPIC  GC/CHLAMYDIA PROBE AMP ([place]) [facility]   ____________________________________________   PROCEDURES  Procedure(s) performed:   Procedures  None  ____________________________________________   INITIAL IMPRESSION / ASSESSMENT AND PLAN / ED COURSE  Pertinent labs & imaging results that were available during my care of the patient were reviewed by me and considered in my medical decision making (see chart for details).   This patient is Presenting for Evaluation of abdominal pain, which does require a range of treatment options, and is a complaint that involves a high risk of morbidity and mortality.  The Differential Diagnoses include round  ligament pain, vaginal infection, PPROM, UTI, premature labor.  I decided to review pertinent External Data, and in summary patient followed at [facility] with last appointment on 08/11/22. No issues at that appointment.  Radiologic Tests: OB US pending    Social Determinants of Health Risk patient is  not an active smoker.   Medical Decision Making: Summary:  Patient presents to the emergency department with left lower quadrant abdominal pain worse over the past 3 days.  Mild increased discharge.  No tenderness on exam.  She continues to feel fetal movement.  Plan to assess fetal heart tones, perform pelvic exam with wet prep/STI swabs, and follow UA. Suspect round ligament pain clinically. Defer abdominal/OB imaging for now.   Care transferred to Dr. Mayra Neer.  Patient's presentation is most consistent with acute illness / injury with system symptoms.   Disposition: discharge  ____________________________________________  FINAL CLINICAL IMPRESSION(S) / ED DIAGNOSES  Final diagnoses:  Round ligament pain     NEW OUTPATIENT MEDICATIONS STARTED DURING THIS VISIT:  Discharge Medication List as of 08/16/2022  4:17 PM     START taking these medications   Details  clotrimazole (GYNE-LOTRIMIN) 1 % vaginal cream Place 1 Applicatorful vaginally at bedtime for 7 days., Starting Sat 08/16/2022, Until Sat 08/23/2022, Normal        Note:  This document was prepared using Dragon voice recognition software and may include unintentional dictation errors.  Nanda Quinton, MD, [facility] Emergency Medicine    Liron Eissler, [facility], MD 08/17/22 [phone]

## 2022-08-17 LAB — URINE CULTURE: Culture: NO GROWTH

## 2022-08-18 LAB — GC/CHLAMYDIA PROBE AMP (~~LOC~~) NOT AT ARMC
Chlamydia: NEGATIVE
Comment: NEGATIVE
Comment: NORMAL
Neisseria Gonorrhea: NEGATIVE

## 2022-09-01 NOTE — L&D Delivery Note (Signed)
Delivery Note Progressed to complete dilation with bulging membranes Membranes ruptured with exam, light to mod meconium present  Pushed well to SVD.  At 12:24 AM a viable and healthy female "Emily Wilkins" was delivered via Vaginal, Spontaneous (Presentation: Left Occiput Anterior).  APGAR: 9, 9; weight  .   Placenta status: Spontaneous, Intact.  Cord: 3 vessels with the following complications: None.   Anesthesia: Epidural Episiotomy: None Lacerations: 2nd degree Suture Repair: 3.0 Monocryl Est. Blood Loss (mL):  107  Mom to postpartum.  Baby to Couplet care / Skin to Skin.  Wynelle Bourgeois 12/12/2022, 12:48 AM

## 2022-09-08 ENCOUNTER — Ambulatory Visit (INDEPENDENT_AMBULATORY_CARE_PROVIDER_SITE_OTHER): Payer: Medicaid Other | Admitting: Student

## 2022-09-08 ENCOUNTER — Encounter: Payer: Self-pay | Admitting: Student

## 2022-09-08 VITALS — BP 109/74 | HR 88 | Wt 159.0 lb

## 2022-09-08 DIAGNOSIS — Z3A26 26 weeks gestation of pregnancy: Secondary | ICD-10-CM | POA: Diagnosis not present

## 2022-09-08 DIAGNOSIS — Z8659 Personal history of other mental and behavioral disorders: Secondary | ICD-10-CM

## 2022-09-08 DIAGNOSIS — Z3482 Encounter for supervision of other normal pregnancy, second trimester: Secondary | ICD-10-CM

## 2022-09-08 DIAGNOSIS — Z3480 Encounter for supervision of other normal pregnancy, unspecified trimester: Secondary | ICD-10-CM

## 2022-09-08 DIAGNOSIS — Z23 Encounter for immunization: Secondary | ICD-10-CM | POA: Diagnosis not present

## 2022-09-08 NOTE — Progress Notes (Signed)
Pt presents for ROB without complaints today.  Flu vaccine given today R Del w/o difficulty.

## 2022-09-08 NOTE — Progress Notes (Signed)
   PRENATAL VISIT NOTE  Subjective:  Emily Wilkins is a 32 y.o. G3P1011 at 20w3dbeing seen today for ongoing prenatal care.  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  Patient reports no complaints. Reports difficult time with falling asleep and staying asleep at night, despite feeling comfortable in bed. Contractions: Not present. Vag. Bleeding: None.  Movement: Present. Denies leaking of fluid.   The following portions of the patient's history were reviewed and updated as appropriate: allergies, current medications, past family history, past medical history, past social history, past surgical history and problem list.   Objective:   Vitals:   09/08/22 1037  BP: 109/74  Pulse: 88  Weight: 159 lb (72.1 kg)    Fetal Status: Fetal Heart Rate (bpm): 144 Fundal Height: 26 cm Movement: Present     General:  Alert, oriented and cooperative. Patient is in no acute distress.  Skin: Skin is warm and dry. No rash noted.   Cardiovascular: Normal heart rate noted  Respiratory: Normal respiratory effort, no problems with respiration noted  Abdomen: Soft, gravid, appropriate for gestational age.  Pain/Pressure: Absent     Pelvic: Cervical exam deferred        Extremities: Normal range of motion.  Edema: None  Mental Status: Normal mood and affect. Normal behavior. Normal judgment and thought content.   Assessment and Plan:  Pregnancy: G3P1011 at 261w3d. Supervision of other normal pregnancy, antepartum - Recommendations for sleep aid discussed. Recommend '5mg'$  of Melatonin and 200-'400mg'$  Of Magnesium at night. Discussed potential for loose stools with Magnesium.  - Frequent fetal movement - Flu Vaccine QUAD 36+ mos IM (Fluarix, Quad PF)  2. [redacted] weeks gestation of  pregnancy - Third Trimester labs next visit - Flu Vaccine QUAD 36+ mos IM (Fluarix, Quad PF)  3. History of depression - stable, on Zoloft  Preterm labor symptoms and general obstetric precautions including but not limited to vaginal bleeding, contractions, leaking of fluid and fetal movement were reviewed in detail with the patient. Please refer to After Visit Summary for other counseling recommendations.   Return in about 2 weeks (around 09/22/2022) for LOB/GTT, IN-PERSON.  Future Appointments  Date Time Provider [place]1/15/2024 10:30 [facility]15/2024 10:45 AM [facility] US4 [facility] [place]  NiJohnston EbbsNP

## 2022-09-15 ENCOUNTER — Ambulatory Visit: Payer: Medicaid Other | Attending: Obstetrics and Gynecology

## 2022-09-15 ENCOUNTER — Ambulatory Visit: Payer: Medicaid Other

## 2022-09-15 DIAGNOSIS — Z3A27 27 weeks gestation of pregnancy: Secondary | ICD-10-CM | POA: Diagnosis not present

## 2022-09-15 DIAGNOSIS — Z362 Encounter for other antenatal screening follow-up: Secondary | ICD-10-CM

## 2022-09-15 DIAGNOSIS — Z3689 Encounter for other specified antenatal screening: Secondary | ICD-10-CM

## 2022-09-24 ENCOUNTER — Encounter: Payer: Self-pay | Admitting: Obstetrics and Gynecology

## 2022-09-24 ENCOUNTER — Other Ambulatory Visit: Payer: Medicaid Other

## 2022-09-24 ENCOUNTER — Ambulatory Visit (INDEPENDENT_AMBULATORY_CARE_PROVIDER_SITE_OTHER): Payer: Medicaid Other | Admitting: Obstetrics and Gynecology

## 2022-09-24 VITALS — BP 107/72 | HR 88 | Wt 160.1 lb

## 2022-09-24 DIAGNOSIS — Z3A28 28 weeks gestation of pregnancy: Secondary | ICD-10-CM

## 2022-09-24 DIAGNOSIS — Z23 Encounter for immunization: Secondary | ICD-10-CM | POA: Diagnosis not present

## 2022-09-24 DIAGNOSIS — O99343 Other mental disorders complicating pregnancy, third trimester: Secondary | ICD-10-CM

## 2022-09-24 DIAGNOSIS — F32A Depression, unspecified: Secondary | ICD-10-CM

## 2022-09-24 DIAGNOSIS — Z3480 Encounter for supervision of other normal pregnancy, unspecified trimester: Secondary | ICD-10-CM

## 2022-09-24 NOTE — Patient Instructions (Signed)

## 2022-09-24 NOTE — Progress Notes (Signed)
Subjective:  Emily Wilkins is a 32 y.o. G3P1011 at 5w5dbeing seen today for ongoing prenatal care.  She is currently monitored for the following issues for this low-risk pregnancy and has Depression affecting pregnancy in third trimester, antepartum and Supervision of other normal pregnancy, antepartum on their problem list.  Patient reports no complaints.  Contractions: Not present. Vag. Bleeding: None.  Movement: Present. Denies leaking of fluid.   The following portions of the patient's history were reviewed and updated as appropriate: allergies, current medications, past family history, past medical history, past social history, past surgical history and problem list. Problem list updated.  Objective:   Vitals:   09/24/22 0945  BP: 107/72  Pulse: 88  Weight: 160 lb 1.6 oz (72.6 kg)    Fetal Status: Fetal Heart Rate (bpm): 132   Movement: Present     General:  Alert, oriented and cooperative. Patient is in no acute distress.  Skin: Skin is warm and dry. No rash noted.   Cardiovascular: Normal heart rate noted  Respiratory: Normal respiratory effort, no problems with respiration noted  Abdomen: Soft, gravid, appropriate for gestational age. Pain/Pressure: Absent     Pelvic:  Cervical exam deferred        Extremities: Normal range of motion.  Edema: None  Mental Status: Normal mood and affect. Normal behavior. Normal judgment and thought content.   Urinalysis:      Assessment and Plan:  Pregnancy: G3P1011 at 229w5d1. [redacted] weeks gestation of pregnancy Stable - Glucose Tolerance, 2 Hours w/1 Hour - CBC - RPR - HIV Antibody (routine testing w rflx) - Tdap vaccine greater than or equal to 7yo IM  2. Supervision of other normal pregnancy, antepartum See above  3. Depression affecting pregnancy in third trimester, antepartum Stable Continue with Zoloft  Preterm labor symptoms and general obstetric precautions including but not limited to vaginal bleeding, contractions,  leaking of fluid and fetal movement were reviewed in detail with the patient. Please refer to After Visit Summary for other counseling recommendations.  Return in about 2 weeks (around 10/08/2022) for OB visit, face to face, any provider.   ErChancy MilroyMD

## 2022-09-24 NOTE — Progress Notes (Signed)
ROB 28.5 wks GTT, CBC, HIV, RPR today  Depression screen negative. Anxiety screen positive. Pt has worked with a Social worker in the past and feels she has the tools to "self soothe" and does not desire a counseling session at this time.  TDAP offered and accepted.  No concerns at this time.

## 2022-09-25 LAB — CBC
Hematocrit: 34.5 % (ref 34.0–46.6)
Hemoglobin: 11.7 g/dL (ref 11.1–15.9)
MCH: 27.6 pg (ref 26.6–33.0)
MCHC: 33.9 g/dL (ref 31.5–35.7)
MCV: 81 fL (ref 79–97)
Platelets: 191 10*3/uL (ref 150–450)
RBC: 4.24 x10E6/uL (ref 3.77–5.28)
RDW: 11.8 % (ref 11.7–15.4)
WBC: 7.1 10*3/uL (ref 3.4–10.8)

## 2022-09-25 LAB — HIV ANTIBODY (ROUTINE TESTING W REFLEX): HIV Screen 4th Generation wRfx: NONREACTIVE

## 2022-09-25 LAB — RPR: RPR Ser Ql: NONREACTIVE

## 2022-09-25 LAB — GLUCOSE TOLERANCE, 2 HOURS W/ 1HR
Glucose, 1 hour: 122 mg/dL (ref 70–179)
Glucose, 2 hour: 72 mg/dL (ref 70–152)
Glucose, Fasting: 75 mg/dL (ref 70–91)

## 2022-10-09 ENCOUNTER — Ambulatory Visit (INDEPENDENT_AMBULATORY_CARE_PROVIDER_SITE_OTHER): Payer: Medicaid Other | Admitting: Obstetrics

## 2022-10-09 ENCOUNTER — Encounter: Payer: Self-pay | Admitting: Obstetrics

## 2022-10-09 VITALS — BP 110/72 | HR 93 | Wt 162.0 lb

## 2022-10-09 DIAGNOSIS — O99343 Other mental disorders complicating pregnancy, third trimester: Secondary | ICD-10-CM

## 2022-10-09 DIAGNOSIS — Z3A30 30 weeks gestation of pregnancy: Secondary | ICD-10-CM

## 2022-10-09 DIAGNOSIS — F32A Depression, unspecified: Secondary | ICD-10-CM

## 2022-10-09 DIAGNOSIS — Z3480 Encounter for supervision of other normal pregnancy, unspecified trimester: Secondary | ICD-10-CM

## 2022-10-09 NOTE — Progress Notes (Signed)
Subjective:  Emily Wilkins is a 32 y.o. G3P1011 at 1w6dbeing seen today for ongoing prenatal care.  She is currently monitored for the following issues for this low-risk pregnancy and has Depression affecting pregnancy in third trimester, antepartum and Supervision of other normal pregnancy, antepartum on their problem list.  Patient reports no complaints.   .  .   . Denies leaking of fluid.   The following portions of the patient's history were reviewed and updated as appropriate: allergies, current medications, past family history, past medical history, past social history, past surgical history and problem list. Problem list updated.  Objective:  There were no vitals filed for this visit.  Fetal Status:           General:  Alert, oriented and cooperative. Patient is in no acute distress.  Skin: Skin is warm and dry. No rash noted.   Cardiovascular: Normal heart rate noted  Respiratory: Normal respiratory effort, no problems with respiration noted  Abdomen: Soft, gravid, appropriate for gestational age.       Pelvic:  Cervical exam deferred        Extremities: Normal range of motion.     Mental Status: Normal mood and affect. Normal behavior. Normal judgment and thought content.   Urinalysis:      Assessment and Plan:  Pregnancy: G3P1011 at 326w6d1. Supervision of other normal pregnancy, antepartum  2. Depression affecting pregnancy in third trimester, antepartum - clinically stable    There are no diagnoses linked to this encounter. Preterm labor symptoms and general obstetric precautions including but not limited to vaginal bleeding, contractions, leaking of fluid and fetal movement were reviewed in detail with the patient. Please refer to After Visit Summary for other counseling recommendations.   Return in about 2 weeks (around 10/23/2022) for ROB.   HaShelly BombardMD 10/09/22

## 2022-10-09 NOTE — Progress Notes (Signed)
Pt presents for ROB visit. No concerns at this time.  

## 2022-10-10 ENCOUNTER — Encounter: Payer: Medicaid Other | Admitting: Obstetrics

## 2022-10-23 ENCOUNTER — Ambulatory Visit (INDEPENDENT_AMBULATORY_CARE_PROVIDER_SITE_OTHER): Payer: Medicaid Other | Admitting: Obstetrics

## 2022-10-23 ENCOUNTER — Encounter: Payer: Self-pay | Admitting: Obstetrics

## 2022-10-23 VITALS — BP 107/73 | HR 99 | Wt 165.0 lb

## 2022-10-23 DIAGNOSIS — Z3480 Encounter for supervision of other normal pregnancy, unspecified trimester: Secondary | ICD-10-CM

## 2022-10-23 DIAGNOSIS — F32A Depression, unspecified: Secondary | ICD-10-CM

## 2022-10-23 DIAGNOSIS — Z3A32 32 weeks gestation of pregnancy: Secondary | ICD-10-CM

## 2022-10-23 DIAGNOSIS — O99343 Other mental disorders complicating pregnancy, third trimester: Secondary | ICD-10-CM

## 2022-10-23 NOTE — Progress Notes (Signed)
Subjective:  Emily Wilkins is a 32 y.o. G3P1011 at 63w6dbeing seen today for ongoing prenatal care.  She is currently monitored for the following issues for this low-risk pregnancy and has Depression affecting pregnancy in third trimester, antepartum and Supervision of other normal pregnancy, antepartum on their problem list.  Patient reports no complaints.  Contractions: Not present. Vag. Bleeding: None.  Movement: Present. Denies leaking of fluid.   The following portions of the patient's history were reviewed and updated as appropriate: allergies, current medications, past family history, past medical history, past social history, past surgical history and problem list. Problem list updated.  Objective:   Vitals:   10/23/22 0956  BP: 107/73  Pulse: 99  Weight: 165 lb (74.8 kg)    Fetal Status: Fetal Heart Rate (bpm): 156   Movement: Present     General:  Alert, oriented and cooperative. Patient is in no acute distress.  Skin: Skin is warm and dry. No rash noted.   Cardiovascular: Normal heart rate noted  Respiratory: Normal respiratory effort, no problems with respiration noted  Abdomen: Soft, gravid, appropriate for gestational age. Pain/Pressure: Absent     Pelvic:  Cervical exam deferred        Extremities: Normal range of motion.  Edema: Trace  Mental Status: Normal mood and affect. Normal behavior. Normal judgment and thought content.   Urinalysis:      Assessment and Plan:  Pregnancy: G3P1011 at 318w6d1. Supervision of other normal pregnancy, antepartum  2. Depression affecting pregnancy in third trimester, antepartum - clinically stable   Preterm labor symptoms and general obstetric precautions including but not limited to vaginal bleeding, contractions, leaking of fluid and fetal movement were reviewed in detail with the patient. Please refer to After Visit Summary for other counseling recommendations.   Return in about 2 weeks (around 11/06/2022) for  [place]  HaShelly BombardMD 10/23/22

## 2022-11-04 ENCOUNTER — Encounter: Payer: Self-pay | Admitting: Obstetrics

## 2022-11-04 ENCOUNTER — Ambulatory Visit (INDEPENDENT_AMBULATORY_CARE_PROVIDER_SITE_OTHER): Payer: Medicaid Other | Admitting: Obstetrics

## 2022-11-04 VITALS — BP 100/62 | HR 83 | Wt 164.0 lb

## 2022-11-04 DIAGNOSIS — F32A Depression, unspecified: Secondary | ICD-10-CM

## 2022-11-04 DIAGNOSIS — O99343 Other mental disorders complicating pregnancy, third trimester: Secondary | ICD-10-CM

## 2022-11-04 DIAGNOSIS — Z3A34 34 weeks gestation of pregnancy: Secondary | ICD-10-CM

## 2022-11-04 DIAGNOSIS — Z3480 Encounter for supervision of other normal pregnancy, unspecified trimester: Secondary | ICD-10-CM

## 2022-11-04 NOTE — Progress Notes (Signed)
Subjective:  Emily Wilkins is a 32 y.o. G3P1011 at 84w4dbeing seen today for ongoing prenatal care.  She is currently monitored for the following issues for this low-risk pregnancy and has Depression affecting pregnancy in third trimester, antepartum and Supervision of other normal pregnancy, antepartum on their problem list.  Patient reports no complaints.  Contractions: Irritability. Vag. Bleeding: None.  Movement: Present. Denies leaking of fluid.   The following portions of the patient's history were reviewed and updated as appropriate: allergies, current medications, past family history, past medical history, past social history, past surgical history and problem list. Problem list updated.  Objective:   Vitals:   11/04/22 0946  BP: 100/62  Pulse: 83  Weight: 164 lb (74.4 kg)    Fetal Status: Fetal Heart Rate (bpm): 150   Movement: Present     General:  Alert, oriented and cooperative. Patient is in no acute distress.  Skin: Skin is warm and dry. No rash noted.   Cardiovascular: Normal heart rate noted  Respiratory: Normal respiratory effort, no problems with respiration noted  Abdomen: Soft, gravid, appropriate for gestational age. Pain/Pressure: Absent     Pelvic:  Cervical exam deferred        Extremities: Normal range of motion.  Edema: Trace  Mental Status: Normal mood and affect. Normal behavior. Normal judgment and thought content.   Urinalysis:      Assessment and Plan:  Pregnancy: G3P1011 at 334w4d1. Supervision of other normal pregnancy, antepartum  2. Depression affecting pregnancy in third trimester, antepartum - clinically stable  Preterm labor symptoms and general obstetric precautions including but not limited to vaginal bleeding, contractions, leaking of fluid and fetal movement were reviewed in detail with the patient. Please refer to After Visit Summary for other counseling recommendations.   Return in about 2 weeks (around 11/18/2022) for  [place]  HaShelly BombardMD 11/04/22

## 2022-11-04 NOTE — Progress Notes (Signed)
ROB, c/o on back and neck on her right side.

## 2022-11-06 ENCOUNTER — Encounter: Payer: Medicaid Other | Admitting: Obstetrics

## 2022-11-18 ENCOUNTER — Other Ambulatory Visit (HOSPITAL_COMMUNITY)
Admission: RE | Admit: 2022-11-18 | Discharge: 2022-11-18 | Disposition: A | Discharge status: Home / Self Care | Payer: Medicaid Other | Source: Ambulatory Visit | Attending: Obstetrics and Gynecology | Admitting: Obstetrics and Gynecology

## 2022-11-18 ENCOUNTER — Ambulatory Visit (INDEPENDENT_AMBULATORY_CARE_PROVIDER_SITE_OTHER): Payer: Medicaid Other | Admitting: Obstetrics and Gynecology

## 2022-11-18 ENCOUNTER — Encounter: Payer: Self-pay | Admitting: Obstetrics and Gynecology

## 2022-11-18 VITALS — BP 102/71 | HR 86 | Wt 167.0 lb

## 2022-11-18 DIAGNOSIS — Z3483 Encounter for supervision of other normal pregnancy, third trimester: Secondary | ICD-10-CM | POA: Insufficient documentation

## 2022-11-18 DIAGNOSIS — F32A Depression, unspecified: Secondary | ICD-10-CM

## 2022-11-18 DIAGNOSIS — Z3A36 36 weeks gestation of pregnancy: Secondary | ICD-10-CM | POA: Diagnosis present

## 2022-11-18 DIAGNOSIS — Z3480 Encounter for supervision of other normal pregnancy, unspecified trimester: Secondary | ICD-10-CM

## 2022-11-18 DIAGNOSIS — O99343 Other mental disorders complicating pregnancy, third trimester: Secondary | ICD-10-CM

## 2022-11-18 NOTE — Progress Notes (Signed)
   PRENATAL VISIT NOTE  Subjective:  Emily Wilkins is a 32 y.o. G3P1011 at [redacted]w[redacted]d being seen today for ongoing prenatal care.  She is currently monitored for the following issues for this low-risk pregnancy and has Depression affecting pregnancy in third trimester, antepartum and Supervision of other normal pregnancy, antepartum on their problem list.  Patient reports no complaints.  Contractions: Regular. Vag. Bleeding: None.  Movement: Present. Denies leaking of fluid.   The following portions of the patient's history were reviewed and updated as appropriate: allergies, current medications, past family history, past medical history, past social history, past surgical history and problem list.   Objective:   Vitals:   11/18/22 1006  BP: 102/71  Pulse: 86  Weight: 167 lb (75.8 kg)    Fetal Status: Fetal Heart Rate (bpm): 155 Fundal Height: 37 cm Movement: Present     General:  Alert, oriented and cooperative. Patient is in no acute distress.  Skin: Skin is warm and dry. No rash noted.   Cardiovascular: Normal heart rate noted  Respiratory: Normal respiratory effort, no problems with respiration noted  Abdomen: Soft, gravid, appropriate for gestational age.  Pain/Pressure: Present     Pelvic: Cervical exam performed in the presence of a chaperone Dilation: 1 Effacement (%): Thick Station: Ballotable  Extremities: Normal range of motion.  Edema: None  Mental Status: Normal mood and affect. Normal behavior. Normal judgment and thought content.   Assessment and Plan:  Pregnancy: G3P1011 at [redacted]w[redacted]d 1. [redacted] weeks gestation of pregnancy - Culture, beta strep (group b only) - Cervicovaginal ancillary only( [place])  2. Supervision of other normal pregnancy, antepartum Patient is doing well without complaints Cultures today  3. Depression affecting pregnancy in third trimester, antepartum Stable Continue Zoloft at current dosing  Preterm labor symptoms and general obstetric  precautions including but not limited to vaginal bleeding, contractions, leaking of fluid and fetal movement were reviewed in detail with the patient. Please refer to After Visit Summary for other counseling recommendations.   Return in about 1 week (around 11/25/2022) for ROB, Low risk, virtual or in person (patient choice).  Future Appointments  Date Time Provider [place]  11/25/2022  1:30 PM Shelly Bombard, MD [facility] None    Mora Bellman, MD

## 2022-11-18 NOTE — Progress Notes (Signed)
ROB 36.[redacted] wks GA GBS, GC/CC today No unusual complaints

## 2022-11-19 LAB — CERVICOVAGINAL ANCILLARY ONLY
Chlamydia: NEGATIVE
Comment: NEGATIVE
Comment: NORMAL
Neisseria Gonorrhea: NEGATIVE

## 2022-11-21 ENCOUNTER — Encounter: Payer: Self-pay | Admitting: Obstetrics and Gynecology

## 2022-11-21 DIAGNOSIS — O99820 Streptococcus B carrier state complicating pregnancy: Secondary | ICD-10-CM | POA: Insufficient documentation

## 2022-11-21 LAB — CULTURE, BETA STREP (GROUP B ONLY): Strep Gp B Culture: POSITIVE — AB

## 2022-11-25 ENCOUNTER — Telehealth (INDEPENDENT_AMBULATORY_CARE_PROVIDER_SITE_OTHER): Payer: Medicaid Other | Admitting: Obstetrics

## 2022-11-25 ENCOUNTER — Encounter: Payer: Self-pay | Admitting: Obstetrics

## 2022-11-25 DIAGNOSIS — Z3A37 37 weeks gestation of pregnancy: Secondary | ICD-10-CM

## 2022-11-25 DIAGNOSIS — Z3480 Encounter for supervision of other normal pregnancy, unspecified trimester: Secondary | ICD-10-CM

## 2022-11-25 DIAGNOSIS — J301 Allergic rhinitis due to pollen: Secondary | ICD-10-CM

## 2022-11-25 DIAGNOSIS — O99820 Streptococcus B carrier state complicating pregnancy: Secondary | ICD-10-CM

## 2022-11-25 DIAGNOSIS — O99512 Diseases of the respiratory system complicating pregnancy, second trimester: Secondary | ICD-10-CM

## 2022-11-25 MED ORDER — LORATADINE 10 MG PO TABS: 10.0000 mg | ORAL_TABLET | Freq: Every day | ORAL | 11 refills | Status: DC

## 2022-11-25 NOTE — Progress Notes (Signed)
   OBSTETRICS PRENATAL VIRTUAL VISIT ENCOUNTER NOTE  Provider location: [facility] [place] [facility]   Patient location: Home  I connected with Emily Wilkins on 11/25/22 at  1:30 PM EDT by MyChart Video Encounter and verified that I am speaking with the correct person using two identifiers. I discussed the limitations, risks, security and privacy concerns of performing an evaluation and management service virtually and the availability of in person appointments. I also discussed with the patient that there may be a patient responsible charge related to this service. The patient expressed understanding and agreed to proceed. Subjective:  Emily Wilkins is a 32 y.o. G3P1011 at [redacted]w[redacted]d being seen today for ongoing prenatal care.  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  Patient reports seasonal allergies to pollen.  Contractions: Irritability. Vag. Bleeding: None.  Movement: Present. Denies any leaking of fluid.   The following portions of the patient's history were reviewed and updated as appropriate: allergies, current medications, past family history, past medical history, past social history, past surgical history and problem list.   Objective:  There were no vitals filed for this visit.  Fetal Status:     Movement: Present     General:  Alert, oriented and cooperative. Patient is in no acute distress.  Respiratory: Normal respiratory effort, no problems with respiration noted  Mental Status: Normal mood and affect. Normal behavior. Normal judgment and thought content.  Rest of physical exam deferred due to type of encounter  Imaging: No results found.  Assessment and Plan:  Pregnancy: G3P1011 at [redacted]w[redacted]d . 1. Supervision of  other normal pregnancy, antepartum  2. GBS (group B Streptococcus carrier), +RV culture, currently pregnant - treat in labor  3. Seasonal allergic rhinitis due to pollen Rx: - loratadine (CLARITIN) 10 MG tablet; Take 1 tablet (10 mg total) by mouth daily.  Dispense: 30 tablet; Refill: 11    Term labor symptoms and general obstetric precautions including but not limited to vaginal bleeding, contractions, leaking of fluid and fetal movement were reviewed in detail with the patient. I discussed the assessment and treatment plan with the patient. The patient was provided an opportunity to ask questions and all were answered. The patient agreed with the plan and demonstrated an understanding of the instructions. The patient was advised to call back or seek an in-person office evaluation/go to [facility] at [facility] for any urgent or concerning symptoms. Please refer to After Visit Summary for other counseling recommendations.   I have spent a total of 15 minutes of non-face-to-face time, excluding clinical staff time, reviewing notes and preparing to see patient, ordering tests and/or medications, and counseling the patient.   Return in about 1 week (around 12/02/2022) for [place].  Future Appointments  Date Time Provider [place]  11/25/2022  1:30 PM Shelly Bombard, MD [place] None    Baltazar Najjar, [place] for [facility], [place], [place] 11/25/22

## 2022-11-25 NOTE — Progress Notes (Signed)
TC to patient for MyChart visit for [place].  Counseled about +GBS. No other questions or concerns.

## 2022-12-02 ENCOUNTER — Encounter: Payer: Medicaid Other | Admitting: Certified Nurse Midwife

## 2022-12-04 ENCOUNTER — Encounter: Payer: Self-pay | Admitting: Student

## 2022-12-04 ENCOUNTER — Ambulatory Visit (INDEPENDENT_AMBULATORY_CARE_PROVIDER_SITE_OTHER): Payer: Medicaid Other | Admitting: Student

## 2022-12-04 VITALS — BP 110/75 | HR 90 | Wt 166.6 lb

## 2022-12-04 DIAGNOSIS — O99343 Other mental disorders complicating pregnancy, third trimester: Secondary | ICD-10-CM

## 2022-12-04 DIAGNOSIS — O99820 Streptococcus B carrier state complicating pregnancy: Secondary | ICD-10-CM

## 2022-12-04 DIAGNOSIS — F32A Depression, unspecified: Secondary | ICD-10-CM

## 2022-12-04 DIAGNOSIS — Z3A38 38 weeks gestation of pregnancy: Secondary | ICD-10-CM

## 2022-12-04 DIAGNOSIS — Z3480 Encounter for supervision of other normal pregnancy, unspecified trimester: Secondary | ICD-10-CM

## 2022-12-04 NOTE — Progress Notes (Signed)
   PRENATAL VISIT NOTE  Subjective:  Emily Wilkins is a 32 y.o. G3P1011 at [redacted]w[redacted]d being seen today for ongoing prenatal care.  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  Patient reports occasional contractions and pelvic pressure .  Contractions: Irritability. Vag. Bleeding: None.  Movement: Present. Denies leaking of fluid.   The following portions of the patient's history were reviewed and updated as appropriate: allergies, current medications, past family history, past medical history, past social history, past surgical history and problem list.   Objective:   Vitals:   12/04/22 1339  BP: 110/75  Pulse: 90  Weight: 166 lb 9.6 oz (75.6 kg)    Fetal Status: Fetal Heart Rate (bpm): 141   Movement: Present     General:  Alert, oriented and cooperative. Patient is in no acute distress.  Skin: Skin is warm and dry. No rash noted.   Cardiovascular: Normal heart rate noted  Respiratory: Normal respiratory effort, no problems with respiration noted  Abdomen: Soft, gravid, appropriate for gestational age.  Pain/Pressure: Absent     Pelvic: Cervical exam performed in the presence of a chaperone Dilation: 1.5 Effacement (%): 20, 30 Station: -3  Extremities: Normal range of motion.  Edema: Trace  Mental Status: Normal mood and affect. Normal behavior. Normal judgment and thought content.   Assessment and Plan:  Pregnancy: G3P1011 at [redacted]w[redacted]d 1. Supervision of other normal pregnancy, antepartum - Doing well, ready for baby!  - Labor precautions reinforced  2. [redacted] weeks gestation of pregnancy - Will plan for NST if pregnancy continues beyond 40weeks  3. GBS (group B Streptococcus carrier), +RV culture, currently pregnant - Counseled today on  management in labor  4. 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 Please refer to After Visit Summary for other counseling recommendations.   Return in about 1 week (around 12/11/2022) for LOB, IN-PERSON, w/NST.  Future Appointments  Date Time Provider [place]  12/11/2022  1:50 PM Johnston Ebbs, NP [place] None     Johnston Ebbs, NP

## 2022-12-04 NOTE — Addendum Note (Signed)
Addended by: Johnston Ebbs on: 12/04/2022 05:28 PM   Modules accepted: Orders

## 2022-12-04 NOTE — Progress Notes (Signed)
Pt presents for ROB visit. Pt has concerns about GBS results. No other concerns.

## 2022-12-08 ENCOUNTER — Inpatient Hospital Stay (HOSPITAL_COMMUNITY)
Admission: AD | Admit: 2022-12-08 | Discharge: 2022-12-08 | Disposition: A | Discharge status: Home / Self Care | Payer: Medicaid Other | Attending: Obstetrics and Gynecology | Admitting: Obstetrics and Gynecology

## 2022-12-08 ENCOUNTER — Encounter (HOSPITAL_COMMUNITY): Payer: Self-pay | Admitting: Obstetrics and Gynecology

## 2022-12-08 ENCOUNTER — Telehealth: Payer: Self-pay | Admitting: *Deleted

## 2022-12-08 ENCOUNTER — Other Ambulatory Visit: Payer: Self-pay

## 2022-12-08 DIAGNOSIS — Z3A39 39 weeks gestation of pregnancy: Secondary | ICD-10-CM | POA: Insufficient documentation

## 2022-12-08 DIAGNOSIS — Z0371 Encounter for suspected problem with amniotic cavity and membrane ruled out: Secondary | ICD-10-CM | POA: Insufficient documentation

## 2022-12-08 DIAGNOSIS — O99820 Streptococcus B carrier state complicating pregnancy: Secondary | ICD-10-CM

## 2022-12-08 DIAGNOSIS — Z3493 Encounter for supervision of normal pregnancy, unspecified, third trimester: Secondary | ICD-10-CM

## 2022-12-08 DIAGNOSIS — Z3A36 36 weeks gestation of pregnancy: Secondary | ICD-10-CM

## 2022-12-08 DIAGNOSIS — Z3480 Encounter for supervision of other normal pregnancy, unspecified trimester: Secondary | ICD-10-CM

## 2022-12-08 LAB — POCT FERN TEST: POCT Fern Test: NEGATIVE

## 2022-12-08 NOTE — MAU Provider Note (Signed)
S: Ms. Tiari Kozlowski is a 32 y.o. G3P1011 at [redacted]w[redacted]d  who presents to MAU today complaining of leaking of fluid since last night. She denies vaginal bleeding. She denies contractions. She reports normal fetal movement since arrival to MAU.     O: BP 113/78 (BP Location: Right Arm)   Pulse 78   Temp 98.1 F (36.7 C) (Oral)   Resp 18   LMP 03/10/2022   SpO2 98%  GENERAL: Well-developed, well-nourished female in no acute distress.  HEAD: Normocephalic, atraumatic.  CHEST: Normal effort of breathing, regular heart rate ABDOMEN: Soft, nontender, gravid PELVIC: Normal external female genitalia. Vagina is pink and rugated. Cervix with normal contour, no lesions. Normal discharge.  Negative for pooling.   Cervical exam:  Dilation: 1.5 Effacement (%): Thick Station: Ballotable Exam by:: Dorathy Daft, CNM   Fetal Monitoring: Baseline: 125 bpm  Variability: moderate Accelerations: 15x15 present  Decelerations: absent  Contractions: occasional   No results found for this or any previous visit (from the past 24 hour(s)).   A: SIUP at [redacted]w[redacted]d  Membranes intact Fern negative x2 and negative for pooling, low suspicion for SROM.    P: Report given to RN to contact MD on call for further instructions  Carlynn Herald, CNM 12/08/2022 5:08 PM

## 2022-12-08 NOTE — MAU Note (Signed)
Emily Wilkins is a 32 y.o. at [redacted]w[redacted]d here in MAU reporting: she noticed LOF fluid both yesterday and today.  Reports fluid was clear.  Denies VB.  Endorses +FM, but less than usual. LMP: NA Onset of complaint: yesterday Pain score: 3 Vitals:   12/08/22 1540  BP: 113/78  Pulse: 78  Resp: 18  Temp: 98.1 F (36.7 C)  SpO2: 98%     FHT:125 bpm Lab orders placed from triage:   None

## 2022-12-08 NOTE — Telephone Encounter (Signed)
TC from pt reporting LOF and decreased FM at 39.[redacted] wks GA. Advised pt to seek care in MAU as soon as possible. Pt verbalized understanding.

## 2022-12-10 ENCOUNTER — Encounter (HOSPITAL_COMMUNITY): Payer: Self-pay

## 2022-12-10 ENCOUNTER — Telehealth (HOSPITAL_COMMUNITY): Payer: Self-pay | Admitting: *Deleted

## 2022-12-10 NOTE — Telephone Encounter (Signed)
Preadmission screen  

## 2022-12-11 ENCOUNTER — Inpatient Hospital Stay (EMERGENCY_DEPARTMENT_HOSPITAL)
Admission: AD | Admit: 2022-12-11 | Discharge: 2022-12-11 | Disposition: A | Discharge status: Home / Self Care | Payer: Medicaid Other | Source: Home / Self Care | Attending: Obstetrics and Gynecology | Admitting: Obstetrics and Gynecology

## 2022-12-11 ENCOUNTER — Telehealth (HOSPITAL_COMMUNITY): Payer: Self-pay | Admitting: *Deleted

## 2022-12-11 ENCOUNTER — Encounter: Payer: Medicaid Other | Admitting: Student

## 2022-12-11 ENCOUNTER — Inpatient Hospital Stay (HOSPITAL_COMMUNITY): Payer: Medicaid Other | Admitting: Anesthesiology

## 2022-12-11 ENCOUNTER — Encounter (HOSPITAL_COMMUNITY): Payer: Self-pay | Admitting: Obstetrics and Gynecology

## 2022-12-11 ENCOUNTER — Inpatient Hospital Stay (HOSPITAL_COMMUNITY)
Admission: AD | Admit: 2022-12-11 | Discharge: 2022-12-13 | DRG: 807 | Disposition: A | Discharge status: Home / Self Care | Payer: Medicaid Other | Attending: Family Medicine | Admitting: Family Medicine

## 2022-12-11 ENCOUNTER — Other Ambulatory Visit: Payer: Self-pay

## 2022-12-11 DIAGNOSIS — O479 False labor, unspecified: Secondary | ICD-10-CM

## 2022-12-11 DIAGNOSIS — Z3A39 39 weeks gestation of pregnancy: Secondary | ICD-10-CM

## 2022-12-11 DIAGNOSIS — Z3480 Encounter for supervision of other normal pregnancy, unspecified trimester: Secondary | ICD-10-CM

## 2022-12-11 DIAGNOSIS — O26893 Other specified pregnancy related conditions, third trimester: Secondary | ICD-10-CM | POA: Diagnosis present

## 2022-12-11 DIAGNOSIS — Z3A40 40 weeks gestation of pregnancy: Secondary | ICD-10-CM | POA: Diagnosis not present

## 2022-12-11 DIAGNOSIS — Z87891 Personal history of nicotine dependence: Secondary | ICD-10-CM

## 2022-12-11 DIAGNOSIS — O99820 Streptococcus B carrier state complicating pregnancy: Secondary | ICD-10-CM

## 2022-12-11 DIAGNOSIS — Z3490 Encounter for supervision of normal pregnancy, unspecified, unspecified trimester: Secondary | ICD-10-CM | POA: Diagnosis present

## 2022-12-11 DIAGNOSIS — O99824 Streptococcus B carrier state complicating childbirth: Secondary | ICD-10-CM | POA: Diagnosis present

## 2022-12-11 DIAGNOSIS — O471 False labor at or after 37 completed weeks of gestation: Secondary | ICD-10-CM

## 2022-12-11 DIAGNOSIS — O365930 Maternal care for other known or suspected poor fetal growth, third trimester, not applicable or unspecified: Secondary | ICD-10-CM | POA: Diagnosis not present

## 2022-12-11 LAB — CBC
HCT: 37.7 % (ref 36.0–46.0)
Hemoglobin: 12.1 g/dL (ref 12.0–15.0)
MCH: 25.7 pg — ABNORMAL LOW (ref 26.0–34.0)
MCHC: 32.1 g/dL (ref 30.0–36.0)
MCV: 80 fL (ref 80.0–100.0)
Platelets: 168 10*3/uL (ref 150–400)
RBC: 4.71 MIL/uL (ref 3.87–5.11)
RDW: 14.7 % (ref 11.5–15.5)
WBC: 9.1 10*3/uL (ref 4.0–10.5)
nRBC: 0 % (ref 0.0–0.2)

## 2022-12-11 LAB — TYPE AND SCREEN
ABO/RH(D): O POS
Antibody Screen: NEGATIVE

## 2022-12-11 MED ORDER — EPHEDRINE 5 MG/ML INJ: 10.0000 mg | INTRAVENOUS | Status: DC | PRN

## 2022-12-11 MED ORDER — SODIUM CHLORIDE 0.9 % IV SOLN: 5.0000 10*6.[IU] | Freq: Once | INTRAVENOUS | Status: DC

## 2022-12-11 MED ORDER — OXYTOCIN BOLUS FROM INFUSION
333.0000 mL | Freq: Once | INTRAVENOUS | Status: AC
  Administered 2022-12-12: 333 mL via INTRAVENOUS

## 2022-12-11 MED ORDER — OXYTOCIN-SODIUM CHLORIDE 30-0.9 UT/500ML-% IV SOLN
2.5000 [IU]/h | INTRAVENOUS | Status: DC
  Filled 2022-12-11: qty 500

## 2022-12-11 MED ORDER — DIPHENHYDRAMINE HCL 50 MG/ML IJ SOLN: 12.5000 mg | INTRAMUSCULAR | Status: DC | PRN

## 2022-12-11 MED ORDER — OXYCODONE-ACETAMINOPHEN 5-325 MG PO TABS: 1.0000 | ORAL_TABLET | ORAL | Status: DC | PRN

## 2022-12-11 MED ORDER — SOD CITRATE-CITRIC ACID 500-334 MG/5ML PO SOLN: 30.0000 mL | ORAL | Status: DC | PRN

## 2022-12-11 MED ORDER — PHENYLEPHRINE 80 MCG/ML (10ML) SYRINGE FOR IV PUSH (FOR BLOOD PRESSURE SUPPORT): 80.0000 ug | PREFILLED_SYRINGE | INTRAVENOUS | Status: DC | PRN

## 2022-12-11 MED ORDER — LACTATED RINGERS IV SOLN: 500.0000 mL | INTRAVENOUS | Status: DC | PRN

## 2022-12-11 MED ORDER — FENTANYL-BUPIVACAINE-NACL 0.5-0.125-0.9 MG/250ML-% EP SOLN
EPIDURAL | Status: AC
  Filled 2022-12-11: qty 250

## 2022-12-11 MED ORDER — ONDANSETRON HCL 4 MG/2ML IJ SOLN: 4.0000 mg | Freq: Four times a day (QID) | INTRAMUSCULAR | Status: DC | PRN

## 2022-12-11 MED ORDER — PENICILLIN G POT IN DEXTROSE 60000 UNIT/ML IV SOLN: 3.0000 10*6.[IU] | INTRAVENOUS | Status: DC

## 2022-12-11 MED ORDER — FENTANYL CITRATE (PF) 100 MCG/2ML IJ SOLN
100.0000 ug | INTRAMUSCULAR | Status: DC | PRN
  Administered 2022-12-11: 100 ug via INTRAVENOUS
  Filled 2022-12-11: qty 2

## 2022-12-11 MED ORDER — LIDOCAINE HCL (PF) 1 % IJ SOLN
INTRAMUSCULAR | Status: DC | PRN
  Administered 2022-12-11: 8 mL via EPIDURAL

## 2022-12-11 MED ORDER — OXYCODONE-ACETAMINOPHEN 5-325 MG PO TABS: 2.0000 | ORAL_TABLET | ORAL | Status: DC | PRN

## 2022-12-11 MED ORDER — SODIUM CHLORIDE 0.9 % IV SOLN
2.0000 g | Freq: Four times a day (QID) | INTRAVENOUS | Status: DC
  Administered 2022-12-11: 2 g via INTRAVENOUS
  Filled 2022-12-11: qty 2000

## 2022-12-11 MED ORDER — LACTATED RINGERS IV SOLN: 500.0000 mL | Freq: Once | INTRAVENOUS | Status: DC

## 2022-12-11 MED ORDER — ACETAMINOPHEN 325 MG PO TABS: 650.0000 mg | ORAL_TABLET | ORAL | Status: DC | PRN

## 2022-12-11 MED ORDER — LIDOCAINE HCL (PF) 1 % IJ SOLN: 30.0000 mL | INTRAMUSCULAR | Status: DC | PRN

## 2022-12-11 MED ORDER — LACTATED RINGERS IV SOLN: INTRAVENOUS | Status: DC

## 2022-12-11 MED ORDER — FENTANYL-BUPIVACAINE-NACL 0.5-0.125-0.9 MG/250ML-% EP SOLN
12.0000 mL/h | EPIDURAL | Status: DC | PRN
  Administered 2022-12-11: 12 mL/h via EPIDURAL

## 2022-12-11 NOTE — MAU Provider Note (Signed)
S: Emily Wilkins is a 32 y.o. G3P1011 at [redacted]w[redacted]d  who presents to MAU today complaining contractions q 4-5 minutes since 7am. She endorses vaginal bleeding earlier today, but none noted while in MAU for 3 hrs. She denies LOF. She reports normal fetal movement.    O: BP 117/75   Pulse 82   Temp 98.1 F (36.7 C)   Resp 18   Ht 5\' 3"  (1.6 m)   Wt 75.8 kg   LMP 03/10/2022   BMI 29.58 kg/m    Cervical exam, per RN Dilation: 2.5 Effacement (%): Thick Cervical Position: Middle Station: -3 Presentation: Vertex Exam by:: weston,rn   Fetal Monitoring: Baseline: 125bpm Variability: moderate Accelerations: +accels Decelerations: no decels Contractions: every 4-5 mis   A: SIUP at [redacted]w[redacted]d  False labor  P: Discharge home with labor precautions  Alexzandrea Normington, Nadene Rubins, MD 12/11/2022 4:13 PM

## 2022-12-11 NOTE — MAU Note (Signed)
  Contractions back to back. Denies leaking of fluid. Reports some bloody show. Has not been able to keep track of fetal movements.

## 2022-12-11 NOTE — Telephone Encounter (Signed)
Preadmission screen  

## 2022-12-11 NOTE — MAU Note (Signed)
.  Emily Wilkins is a 32 y.o. at [redacted]w[redacted]d here in MAU reporting: ctx q 5-10 min since 7am. Had some blood when wiping. Good fetal movement . No leaking . 2.5cm on last exam  LMP:  Onset of complaint: 7am Pain score: 7  Vitals:   12/11/22 1031  BP: 117/77  Pulse: 75  Resp: 18  Temp: 98.1 F (36.7 C)     FHT:130 Lab orders placed from triage:

## 2022-12-11 NOTE — H&P (Signed)
Emily Wilkins is a 32 y.o. female presenting for painful contractions.  She was seen earlier today for Labor Eval and did not change so was sent home.  Is now 6cm. . 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 Social History:  reports that she quit smoking about 9 months ago. Her smoking use included pipe. She has never used smokeless tobacco. She reports that she does not drink alcohol and does not use drugs.     Maternal Diabetes: No Genetic Screening: Normal Maternal Ultrasounds/Referrals: Normal Fetal Ultrasounds or other Referrals:  None Maternal Substance Abuse:  No Significant Maternal Medications:  None Significant Maternal Lab Results:  Group B Strep positive Number of Prenatal Visits:greater than 3 verified prenatal visits Other Comments:  None  Review of Systems  Constitutional:  Negative for fever.  Eyes:  Negative for visual disturbance.  Respiratory:  Negative for shortness of breath.   Gastrointestinal:  Positive for abdominal pain.  Genitourinary:  Positive for pelvic pain. Negative for vaginal bleeding.   Maternal Medical History:   Reason for admission: Contractions.   Contractions: Onset was 6-12 hours ago.   Frequency: regular.   Perceived severity is strong.   Fetal activity: Perceived fetal activity is normal.   Last perceived fetal movement was within the past hour.   Prenatal complications: No bleeding, PIH, IUGR or placental abnormality.   Prenatal Complications - Diabetes: none.   Dilation: 6 Effacement (%): 70 Station: -1 Exam by:: Karl Ito, RN Blood pressure 136/77, pulse 69, temperature 98.3 F (36.8 C), temperature source Oral, resp. rate 17, last menstrual period 03/10/2022. Maternal Exam:  Uterine Assessment: Contraction strength is firm.  Contraction frequency is regular.  Abdomen: Patient reports no abdominal tenderness. Estimated fetal weight is 7.   Fetal presentation: vertex Introitus: Normal vulva. Normal vagina.  Pelvis: adequate for delivery.   Cervix: Cervix evaluated by digital exam.     Fetal Exam Fetal Monitor Review: Mode: ultrasound.   Baseline rate: 140.  Variability: moderate (6-25 bpm).   Pattern: no decelerations and accelerations present.   Fetal State Assessment: Category I - tracings are normal.   Physical Exam Constitutional:  General: She is not in acute distress.    Appearance: She is not ill-appearing or toxic-appearing.  HENT:     Head: Normocephalic.  Cardiovascular:     Rate and Rhythm: Normal rate.  Pulmonary:     Effort: Pulmonary effort is normal.  Abdominal:     Tenderness: There is no abdominal tenderness. There is no guarding.  Genitourinary:    General: Normal vulva.     Comments: Dilation: 6 Effacement (%): 70 Station: -1 Presentation: Vertex Exam by:: Karl Ito, RN  Musculoskeletal:        General: Normal range of motion.     Cervical back: Normal range of motion.  Skin:    General: Skin is warm and dry.  Neurological:     General: No focal deficit present.     Mental Status: She is alert.  Psychiatric:         Mood and Affect: Mood normal.     Prenatal labs: ABO, Rh: O/Positive/-- (10/16 1340) Antibody: Negative (10/16 1340) Rubella: 3.56 (10/16 1340) RPR: Non Reactive (01/24 0938)  HBsAg: Negative (10/16 1340)  HIV: Non Reactive (01/24 7225)  GBS: Positive/-- (03/19 1044)   Assessment/Plan: Single IUP at [redacted]w[redacted]d Active Labor GBS positive  Admit to Labor and Delivery Routine orders Ampicillin for GBS Fentanyl until she can get her epidural Anticipate SVD    Wynelle Bourgeois 12/11/2022, 9:11 PM

## 2022-12-11 NOTE — Anesthesia Preprocedure Evaluation (Signed)
Anesthesia Evaluation  Patient identified by MRN, date of birth, ID band Patient awake    Reviewed: Allergy & Precautions, H&P , NPO status , Patient's Chart, lab work & pertinent test results, reviewed documented beta blocker date and time   Airway Mallampati: I  TM Distance: >3 FB Neck ROM: full    Dental no notable dental hx. (+) Teeth Intact, Dental Advisory Given   Pulmonary neg pulmonary ROS, former smoker   Pulmonary exam normal breath sounds clear to auscultation       Cardiovascular negative cardio ROS Normal cardiovascular exam Rhythm:regular Rate:Normal     Neuro/Psych  PSYCHIATRIC DISORDERS Anxiety Depression    negative neurological ROS     GI/Hepatic negative GI ROS, Neg liver ROS,,,  Endo/Other  negative endocrine ROS    Renal/GU negative Renal ROS  negative genitourinary   Musculoskeletal   Abdominal   Peds  Hematology negative hematology ROS (+) Blood dyscrasia, anemia   Anesthesia Other Findings   Reproductive/Obstetrics (+) Pregnancy                              Anesthesia Physical Anesthesia Plan  ASA: 2  Anesthesia Plan: Epidural   Post-op Pain Management: Minimal or no pain anticipated   Induction:   PONV Risk Score and Plan: 2  Airway Management Planned: Natural Airway  Additional Equipment: None  Intra-op Plan:   Post-operative Plan:   Informed Consent: I have reviewed the patients History and Physical, chart, labs and discussed the procedure including the risks, benefits and alternatives for the proposed anesthesia with the patient or authorized representative who has indicated his/her understanding and acceptance.     Dental Advisory Given  Plan Discussed with: Anesthesiologist and CRNA  Anesthesia Plan Comments: (Labs checked- platelets confirmed with RN in room. Fetal heart tracing, per RN, reported to be stable enough for sitting  procedure. Discussed epidural, and patient consents to the procedure:  included risk of possible headache,backache, failed block, allergic reaction, and nerve injury. This patient was asked if she had any questions or concerns before the procedure started.)         Anesthesia Quick Evaluation

## 2022-12-12 ENCOUNTER — Encounter (HOSPITAL_COMMUNITY): Payer: Self-pay | Admitting: Family Medicine

## 2022-12-12 DIAGNOSIS — O99820 Streptococcus B carrier state complicating pregnancy: Secondary | ICD-10-CM

## 2022-12-12 DIAGNOSIS — Z3A40 40 weeks gestation of pregnancy: Secondary | ICD-10-CM

## 2022-12-12 DIAGNOSIS — O365930 Maternal care for other known or suspected poor fetal growth, third trimester, not applicable or unspecified: Secondary | ICD-10-CM

## 2022-12-12 LAB — RPR: RPR Ser Ql: NONREACTIVE

## 2022-12-12 LAB — BIRTH TISSUE RECOVERY COLLECTION (PLACENTA DONATION)

## 2022-12-12 MED ORDER — IBUPROFEN 600 MG PO TABS
600.0000 mg | ORAL_TABLET | Freq: Four times a day (QID) | ORAL | Status: DC
  Administered 2022-12-12 – 2022-12-13 (×6): 600 mg via ORAL
  Filled 2022-12-12 (×6): qty 1

## 2022-12-12 MED ORDER — ZOLPIDEM TARTRATE 5 MG PO TABS: 5.0000 mg | ORAL_TABLET | Freq: Every evening | ORAL | Status: DC | PRN

## 2022-12-12 MED ORDER — COCONUT OIL OIL: 1.0000 | TOPICAL_OIL | Status: DC | PRN

## 2022-12-12 MED ORDER — DIPHENHYDRAMINE HCL 25 MG PO CAPS: 25.0000 mg | ORAL_CAPSULE | Freq: Four times a day (QID) | ORAL | Status: DC | PRN

## 2022-12-12 MED ORDER — DIBUCAINE (PERIANAL) 1 % EX OINT: 1.0000 | TOPICAL_OINTMENT | CUTANEOUS | Status: DC | PRN

## 2022-12-12 MED ORDER — ONDANSETRON HCL 4 MG/2ML IJ SOLN: 4.0000 mg | INTRAMUSCULAR | Status: DC | PRN

## 2022-12-12 MED ORDER — ACETAMINOPHEN 325 MG PO TABS: 650.0000 mg | ORAL_TABLET | ORAL | Status: DC | PRN

## 2022-12-12 MED ORDER — PRENATAL MULTIVITAMIN CH
1.0000 | ORAL_TABLET | Freq: Every day | ORAL | Status: DC
  Administered 2022-12-12 – 2022-12-13 (×2): 1 via ORAL
  Filled 2022-12-12 (×2): qty 1

## 2022-12-12 MED ORDER — TETANUS-DIPHTH-ACELL PERTUSSIS 5-2.5-18.5 LF-MCG/0.5 IM SUSY: 0.5000 mL | PREFILLED_SYRINGE | Freq: Once | INTRAMUSCULAR | Status: DC

## 2022-12-12 MED ORDER — WITCH HAZEL-GLYCERIN EX PADS: 1.0000 | MEDICATED_PAD | CUTANEOUS | Status: DC | PRN

## 2022-12-12 MED ORDER — SENNOSIDES-DOCUSATE SODIUM 8.6-50 MG PO TABS
2.0000 | ORAL_TABLET | ORAL | Status: DC
  Administered 2022-12-12 – 2022-12-13 (×2): 2 via ORAL
  Filled 2022-12-12 (×2): qty 2

## 2022-12-12 MED ORDER — BENZOCAINE-MENTHOL 20-0.5 % EX AERO
1.0000 | INHALATION_SPRAY | CUTANEOUS | Status: DC | PRN
  Filled 2022-12-12: qty 56

## 2022-12-12 MED ORDER — SERTRALINE HCL 50 MG PO TABS
50.0000 mg | ORAL_TABLET | Freq: Every day | ORAL | Status: DC
  Administered 2022-12-12 – 2022-12-13 (×2): 50 mg via ORAL
  Filled 2022-12-12 (×2): qty 1

## 2022-12-12 MED ORDER — SIMETHICONE 80 MG PO CHEW: 80.0000 mg | CHEWABLE_TABLET | ORAL | Status: DC | PRN

## 2022-12-12 MED ORDER — ONDANSETRON HCL 4 MG PO TABS: 4.0000 mg | ORAL_TABLET | ORAL | Status: DC | PRN

## 2022-12-12 NOTE — Lactation Note (Signed)
This note was copied from a baby's chart. Lactation Consultation Note  Patient Name: Emily Wilkins HQION'G Date: 12/12/2022 Age:30 hours Reason for consult: Follow-up assessment;Term (per mom the baby fed 25 mins prior to the bath, LC updated in the doc flow sheets. Mom aware to call for the next feeding with feeding cues  for [name] .)   Maternal Data Has patient been taught Hand Expression?:  (exp BF x 1 for 1 year) Does the patient have breastfeeding experience prior to this delivery?: Yes How long did the patient breastfeed?: 1st baby 1 year ( now 75 1/2 years old )  Feeding Mother's Current Feeding Choice: Breast Milk  LATCH Score - mom aware we need to obtain a latch score   Lactation Tools Discussed/Used  Mom desires a hand pump  Interventions Interventions: Breast feeding basics reviewed;Education;LC Services brochure  Discharge Pump: Personal;Hands Free (per mom Mom Cozy)  Consult Status Consult Status: Follow-up Date: 12/12/22 Follow-up type: In-patient    Emily Wilkins 12/12/2022, 4:10 PM

## 2022-12-12 NOTE — Discharge Summary (Signed)
Postpartum Discharge Summary  Date of Service updated***     Patient Name: Emily Wilkins DOB: 1991-06-30 MRN: [id number]  Date of admission: 12/11/2022 Delivery date:12/12/2022  Delivering provider: Aviva Signs  Date of discharge: 12/12/2022  Admitting diagnosis: Encounter for induction of labor [Z34.90] Intrauterine pregnancy: [redacted]w[redacted]d     Secondary diagnosis:  Principal Problem:   Encounter for induction of labor Active Problems:   Vaginal delivery  Additional problems: none    Discharge diagnosis: Term Pregnancy Delivered                                              Post partum procedures:{Postpartum procedures:23558} Augmentation: AROM Complications: None  Hospital course: Onset of Labor With Vaginal Delivery      32 y.o. yo G3P1011 at [redacted]w[redacted]d was admitted in Active Labor on 12/11/2022. Labor course was complicated by nothing  Membrane Rupture Time/Date: 11:45 PM ,12/11/2022   Delivery Method:Vaginal, Spontaneous  Episiotomy: None  Lacerations:  2nd degree  Patient had a postpartum course complicated by ***.  She is ambulating, tolerating a regular diet, passing flatus, and urinating well. Patient is discharged home in stable condition on 12/12/22.  Newborn Data: Birth date:12/12/2022  Birth time:12:24 AM  Gender:Female  Living status:Living  Apgars:9 ,9  Weight:   Magnesium Sulfate received: No BMZ received: No Rhophylac:N/A MMR:No T-DaP:Given prenatally Flu: No Transfusion:{Transfusion received:30440034}  Physical exam  Vitals:   12/11/22 2330 12/12/22 0000 12/12/22 0030 12/12/22 0046  BP: 113/71 107/61 (!) 110/58 (!) 106/57  Pulse: 69 79 100 83  Resp: 17 15    Temp:      TempSrc:       General: {Exam; general:21111117} Lochia: {Desc; appropriate/inappropriate:30686::"appropriate"} Uterine Fundus: {Desc; firm/soft:30687} Incision: {Exam; incision:21111123} DVT Evaluation: {Exam; dvt:2111122} Labs: Lab Results  Component Value Date   WBC 9.1  12/11/2022   HGB 12.1 12/11/2022   HCT 37.7 12/11/2022   MCV 80.0 12/11/2022   PLT 168 12/11/2022      Latest Ref Rng & Units 09/07/2020   12:35 PM  CMP  Glucose 65 - 99 mg/dL 58   BUN 7 - 25 mg/dL 9   Creatinine 4.09 - 8.11 mg/dL 9.14   Sodium 782 - 956 mmol/L 139   Potassium 3.5 - 5.3 mmol/L 3.9   Chloride 98 - 110 mmol/L 104   CO2 20 - 32 mmol/L 27   Calcium 8.6 - 10.2 mg/dL 9.6   Total Protein 6.1 - 8.1 g/dL 7.6   Total Bilirubin 0.2 - 1.2 mg/dL 0.7   AST 10 - 30 U/L 16   ALT 6 - 29 U/L 11    Edinburgh Score:    01/26/2019    1:37 PM  Edinburgh Postnatal Depression Scale Screening Tool  I have been able to laugh and see the funny side of things. 0  I have looked forward with enjoyment to things. 0  I have blamed myself unnecessarily when things went wrong. 2  I have been anxious or worried for no good reason. 2  I have felt scared or panicky for no good reason. 2  Things have been getting on top of me. 1  I have been so unhappy that I have had difficulty sleeping. 0  I have felt sad or miserable. 1  I have been so unhappy that I have been crying. 0  The thought  of harming myself has occurred to me. 0  Edinburgh Postnatal Depression Scale Total 8      After visit meds:  Allergies as of 12/12/2022   No Known Allergies   Med Rec must be completed prior to using this [facility]***        Discharge home in stable condition Infant Feeding: Breast Infant Disposition:{CHL IP OB HOME WITH TSVXBL:39030} Discharge instruction: per After Visit Summary and Postpartum booklet. Activity: Advance as tolerated. Pelvic rest for 6 weeks.  Diet: routine diet Anticipated Birth Control:  Declines Postpartum Appointment:{Outpatient follow up:23559} Additional Postpartum F/U: {PP Procedure:23957} Future Appointments:No future appointments. Follow up Visit:      12/12/2022 Wynelle Bourgeois, CNM

## 2022-12-12 NOTE — Lactation Note (Signed)
This note was copied from a baby's chart. Lactation Consultation Note  Patient Name: Emily Wilkins XIPJA'S Date: 12/12/2022 Age:30 hours Reason for consult: Initial assessment;Term (as LC entered the room , mom eating her lunch and LC mentioned she could come back.)   Maternal Data    Feeding Mother's Current Feeding Choice: Breast Milk     Consult Status Consult Status: Follow-up Date: 12/12/22 Follow-up type: In-patient    Matilde Sprang Jaysha Lasure 12/12/2022, 2:53 PM

## 2022-12-12 NOTE — Plan of Care (Signed)

## 2022-12-13 MED ORDER — IBUPROFEN 600 MG PO TABS: 600.0000 mg | ORAL_TABLET | Freq: Three times a day (TID) | ORAL | 0 refills | Status: DC | PRN

## 2022-12-13 NOTE — Discharge Instructions (Signed)
-   Continue your prenatal vitamins especially if breastfeeding - Try to eat iron rich food. - Take over the counter tylenol (500mg) or ibuprofen (200mg) three times a day as needed for cramping/pain. - follow up in clinic in 4-6 weeks as scheduled for your regular post partum visit. - Please come back to MAU if you notice persistently elevated blood pressures or you start to have a headache, that doesn't get better with medications (tylenol and ibuprofen), rest (4hrs of sleep) and drinking water.  

## 2022-12-13 NOTE — Progress Notes (Signed)
[name] was referred for history of depression/anxiety. * Referral screened out by Clinical Social Worker because none of the following criteria appear to apply: ~ History of anxiety/depression during this pregnancy, or of post-partum depression following prior delivery. ~ Diagnosis of anxiety and/or depression within last 3 years OR * [name]'s symptoms currently being treated with medication and/or therapy. [name] takes sertraline 50mg daily. Edinburgh score of 3 during current admission. Please contact the Clinical Social Worker if needs arise, by [name] request, or if [name] scores greater than 9/yes to question 10 on Edinburgh Postpartum Depression Screen.  Signed,  Acea Yagi K. Dayshawn Irizarry, MSW, LCSWA, LCASA 12/13/2022 8:38 AM 

## 2022-12-13 NOTE — Lactation Note (Signed)
This note was copied from a baby's chart. Lactation Consultation Note  Patient Name: Emily Wilkins HUTML'Y Date: 12/13/2022 Age:30 hours Reason for consult: Term;Other (Comment) (Discharge)  [facility] student entered room due to dyad being discharged. [name] did not have any questions or concerns. [name] stated that infant is cluster feeding.  Discussed the following with [name]: -Output expectations  -Storage guidelines -Engorgement and mastitis treatment and prevention -Feeding cues -When to call pediatrician  Plan: Latch infant on demand or 8-12x/24hr. Pump as needed. Encouraged [name] to contact [facility] outpatient resources for questions or concerns.   Maternal Data Does the patient have breastfeeding experience prior to this delivery?: Yes  Feeding Mother's Current Feeding Choice: Breast Milk  LATCH Score Latch: Grasps breast easily, tongue down, lips flanged, rhythmical sucking.  Audible Swallowing: Spontaneous and intermittent  Type of Nipple: Everted at rest and after stimulation  Comfort (Breast/Nipple): Soft / non-tender  Hold (Positioning): No assistance needed to correctly position infant at breast.  LATCH Score: 10   Discharge Discharge Education: Engorgement and breast care;Warning signs for feeding baby;Outpatient recommendation  Consult Status Consult Status: Complete Date: 12/13/22 Follow-up type: Out-patient    Tajae Rybicki 12/13/2022, 2:48 PM

## 2022-12-13 NOTE — Anesthesia Postprocedure Evaluation (Signed)
Anesthesia Post Note  Patient: Emily Wilkins  Procedure(s) Performed: AN AD HOC LABOR EPIDURAL     Patient location during evaluation: Mother Baby Anesthesia Type: Epidural Level of consciousness: awake and alert and oriented Pain management: satisfactory to patient Vital Signs Assessment: post-procedure vital signs reviewed and stable Respiratory status: respiratory function stable Cardiovascular status: stable Postop Assessment: no headache, no backache, epidural receding, patient able to bend at knees, no signs of nausea or vomiting, adequate PO intake and able to ambulate Anesthetic complications: no   No notable events documented.  Last Vitals:  Vitals:   12/12/22 1630 12/12/22 2011  BP: 103/67 102/63  Pulse: 74 71  Resp: 20 19  Temp: 36.9 C 36.7 C  SpO2: 98% 97%    Last Pain:  Vitals:   12/12/22 2320  TempSrc:   PainSc: 0-No pain   Pain Goal:                   Sten Dematteo

## 2022-12-19 ENCOUNTER — Inpatient Hospital Stay (HOSPITAL_COMMUNITY): Admission: RE | Admit: 2022-12-19 | Payer: Medicaid Other | Source: Home / Self Care | Admitting: Family Medicine

## 2022-12-19 ENCOUNTER — Inpatient Hospital Stay (HOSPITAL_COMMUNITY): Payer: Medicaid Other

## 2022-12-23 ENCOUNTER — Telehealth (HOSPITAL_COMMUNITY): Payer: Self-pay

## 2022-12-23 NOTE — Telephone Encounter (Signed)
Patient did not answer phone call. Voicemail left for patient.   Suann Larry [facility] Perinatal Services   12/23/22,1341

## 2023-01-14 ENCOUNTER — Ambulatory Visit: Payer: Medicaid Other | Admitting: Obstetrics and Gynecology

## 2023-05-28 ENCOUNTER — Other Ambulatory Visit: Payer: Self-pay

## 2023-05-28 DIAGNOSIS — F419 Anxiety disorder, unspecified: Secondary | ICD-10-CM

## 2023-05-28 NOTE — Telephone Encounter (Signed)
Med refill request: sertraline 50mg  #90 Last AEX: 03/18/22 Dr. Oscar La Next AEX: none scheduled Last MMG (if hormonal med) n/a Refill denied.  Pharmacy notified needs appointment.

## 2023-09-07 ENCOUNTER — Encounter: Payer: Self-pay | Admitting: Advanced Practice Midwife

## 2023-09-07 ENCOUNTER — Ambulatory Visit: Payer: Medicaid Other | Admitting: Advanced Practice Midwife

## 2023-09-07 ENCOUNTER — Other Ambulatory Visit (HOSPITAL_COMMUNITY)
Admission: RE | Admit: 2023-09-07 | Discharge: 2023-09-07 | Disposition: A | Discharge status: Home / Self Care | Payer: Medicaid Other | Source: Ambulatory Visit | Attending: Advanced Practice Midwife | Admitting: Advanced Practice Midwife

## 2023-09-07 VITALS — BP 110/69 | HR 83 | Ht 63.0 in | Wt 153.8 lb

## 2023-09-07 DIAGNOSIS — N898 Other specified noninflammatory disorders of vagina: Secondary | ICD-10-CM

## 2023-09-07 DIAGNOSIS — N939 Abnormal uterine and vaginal bleeding, unspecified: Secondary | ICD-10-CM | POA: Diagnosis present

## 2023-09-07 NOTE — Progress Notes (Signed)
 Wants to discuss restarting Zoloft . Cramping more before and after last menses. Flow heavier with clots. Lasted til approx Dec 13th to Dec 19th. Denies bladder issues. Continues breast feeding alone. Using withdrawal method BCM. States some stringy vaginal discharge noted.

## 2023-09-07 NOTE — Progress Notes (Signed)
   GYNECOLOGY PROGRESS NOTE  History:  33 y.o. [id number] presents to [facility] office today for problem gyn visit. She reports regular menses since 1-2 months after her son was born 12/12/22, then last month she had cramping for 1 week, then her period started 1 week early.  Both pain and bleeding resolved but there is thick mucus discharge.  Pt is breastfeeding and is using withdrawal for contraception.    She denies h/a, dizziness, shortness of breath, n/v, or fever/chills.    The following portions of the patient's history were reviewed and updated as appropriate: allergies, current medications, past family history, past medical history, past social history, past surgical history and problem list. Last pap smear on 09/07/20 was normal.  Health Maintenance Due  Topic Date Due   HPV VACCINES (2 - 3-dose SCDM series) 04/15/2022   INFLUENZA VACCINE  04/02/2023   COVID-19 Vaccine (1 - 2024-25 season) Never done   Cervical Cancer Screening (HPV/Pap Cotest)  09/08/2023     Review of Systems:  Pertinent items are noted in HPI.   Objective:  Physical Exam Blood pressure 110/69, pulse 83, height 5' 3 (1.6 m), weight 153 lb 12.8 oz (69.8 kg), last menstrual period 08/14/2023, currently breastfeeding. VS reviewed, nursing note reviewed,  Constitutional: well developed, well nourished, no distress HEENT: normocephalic CV: normal rate Pulm/chest wall: normal effort Breast Exam: deferred Abdomen: soft Neuro: alert and oriented x 3 Skin: warm, dry Psych: affect normal Pelvic exam:  Bimanual exam: Cervix 0/long/high, firm, anterior, neg CMT, uterus nontender, nonenlarged, adnexa without tenderness, enlargement, or mass  Assessment & Plan:  1. Abnormal uterine bleeding (AUB) --Likely anovulatory cycle, baby is 54 months old so breastfeeding less in the last couple of months and eating solid food, which may change menstrual pattern.  --Vaginal swab for infection --Pt to return if menses are  abnormal in the next couple of months - Cervicovaginal ancillary only( [place])   No follow-ups on file.   Olam Boards, CNM 3:29 PM

## 2023-09-08 LAB — CERVICOVAGINAL ANCILLARY ONLY
Bacterial Vaginitis (gardnerella): NEGATIVE
Candida Glabrata: NEGATIVE
Candida Vaginitis: NEGATIVE
Chlamydia: NEGATIVE
Comment: NEGATIVE
Comment: NEGATIVE
Comment: NEGATIVE
Comment: NEGATIVE
Comment: NEGATIVE
Comment: NORMAL
Neisseria Gonorrhea: NEGATIVE
Trichomonas: NEGATIVE

## 2023-10-05 ENCOUNTER — Ambulatory Visit: Payer: Medicaid Other | Admitting: Advanced Practice Midwife

## 2023-12-23 ENCOUNTER — Other Ambulatory Visit: Payer: Self-pay | Admitting: Obstetrics

## 2023-12-23 DIAGNOSIS — J301 Allergic rhinitis due to pollen: Secondary | ICD-10-CM

## 2024-01-18 ENCOUNTER — Other Ambulatory Visit (HOSPITAL_COMMUNITY)
Admission: RE | Admit: 2024-01-18 | Discharge: 2024-01-18 | Disposition: A | Discharge status: Home / Self Care | Source: Ambulatory Visit | Attending: Obstetrics and Gynecology | Admitting: Obstetrics and Gynecology

## 2024-01-18 ENCOUNTER — Ambulatory Visit

## 2024-01-18 VITALS — BP 108/75 | HR 97

## 2024-01-18 DIAGNOSIS — N898 Other specified noninflammatory disorders of vagina: Secondary | ICD-10-CM

## 2024-01-18 NOTE — Progress Notes (Signed)
 SUBJECTIVE:  33 y.o. female complains of brows/ yellow vaginal discharge for 8 day(s). Denies abnormal vaginal bleeding or significant pelvic pain or fever. No UTI symptoms. Denies history of known exposure to STD.  Patient's last menstrual period was 01/12/2024 (exact date).  OBJECTIVE:  She appears well, afebrile. Urine dipstick: not done.  ASSESSMENT:  Vaginal Discharge  Vaginal Odor   PLAN:  GC, chlamydia, trichomonas, BVAG, CVAG probe sent to lab. Treatment: To be determined once lab results are received ROV prn if symptoms persist or worsen.

## 2024-01-19 LAB — CERVICOVAGINAL ANCILLARY ONLY
Bacterial Vaginitis (gardnerella): NEGATIVE
Candida Glabrata: NEGATIVE
Candida Vaginitis: NEGATIVE
Chlamydia: NEGATIVE
Comment: NEGATIVE
Comment: NEGATIVE
Comment: NEGATIVE
Comment: NEGATIVE
Comment: NEGATIVE
Comment: NORMAL
Neisseria Gonorrhea: NEGATIVE
Trichomonas: NEGATIVE

## 2024-02-23 ENCOUNTER — Ambulatory Visit

## 2024-02-23 VITALS — BP 99/68 | HR 66

## 2024-02-23 DIAGNOSIS — Z3201 Encounter for pregnancy test, result positive: Secondary | ICD-10-CM

## 2024-02-23 DIAGNOSIS — Z3480 Encounter for supervision of other normal pregnancy, unspecified trimester: Secondary | ICD-10-CM | POA: Insufficient documentation

## 2024-02-23 LAB — POCT URINE PREGNANCY: Preg Test, Ur: POSITIVE — AB

## 2024-02-23 NOTE — Progress Notes (Signed)
 Emily Wilkins here for a UPT. Pt had a positive upt at home. LMP is 01/12/24.     UPT in office Positive.    Reviewed medications and informed to start a PNV, if not already. Pt to follow up in 3 weeks for New OB visit.

## 2024-03-29 ENCOUNTER — Ambulatory Visit: Admitting: *Deleted

## 2024-03-29 ENCOUNTER — Other Ambulatory Visit (INDEPENDENT_AMBULATORY_CARE_PROVIDER_SITE_OTHER): Payer: Self-pay

## 2024-03-29 VITALS — BP 101/66 | HR 75 | Wt 142.4 lb

## 2024-03-29 DIAGNOSIS — M549 Dorsalgia, unspecified: Secondary | ICD-10-CM | POA: Diagnosis not present

## 2024-03-29 DIAGNOSIS — Z3A12 12 weeks gestation of pregnancy: Secondary | ICD-10-CM | POA: Diagnosis not present

## 2024-03-29 DIAGNOSIS — Z1331 Encounter for screening for depression: Secondary | ICD-10-CM

## 2024-03-29 DIAGNOSIS — O0993 Supervision of high risk pregnancy, unspecified, third trimester: Secondary | ICD-10-CM | POA: Diagnosis not present

## 2024-03-29 DIAGNOSIS — Z3A31 31 weeks gestation of pregnancy: Secondary | ICD-10-CM | POA: Diagnosis not present

## 2024-03-29 DIAGNOSIS — Z3480 Encounter for supervision of other normal pregnancy, unspecified trimester: Secondary | ICD-10-CM

## 2024-03-29 DIAGNOSIS — Q790 Congenital diaphragmatic hernia: Secondary | ICD-10-CM | POA: Diagnosis not present

## 2024-03-29 DIAGNOSIS — Z3481 Encounter for supervision of other normal pregnancy, first trimester: Secondary | ICD-10-CM

## 2024-03-29 DIAGNOSIS — O3680X0 Pregnancy with inconclusive fetal viability, not applicable or unspecified: Secondary | ICD-10-CM

## 2024-03-29 DIAGNOSIS — O99013 Anemia complicating pregnancy, third trimester: Secondary | ICD-10-CM | POA: Diagnosis not present

## 2024-03-29 DIAGNOSIS — O99891 Other specified diseases and conditions complicating pregnancy: Secondary | ICD-10-CM | POA: Diagnosis not present

## 2024-03-29 NOTE — Progress Notes (Signed)
 New OB Intake  I connected with Emily Wilkins  on 03/29/24 at 10:15 AM EDT by In Person Visit and verified that I am speaking with the correct person using two identifiers. Nurse is located at [facility] and pt is located at [place].  I discussed the limitations, risks, security and privacy concerns of performing an evaluation and management service by telephone and the availability of in person appointments. I also discussed with the patient that there may be a patient responsible charge related to this service. The patient expressed understanding and agreed to proceed.  I explained I am completing New OB Intake today. We discussed EDD of 10/10/24 based on US  at [redacted]w[redacted]d weeks. Pt is [id number]. I reviewed her allergies, medications and Medical/Surgical/OB history.    Patient Active Problem List   Diagnosis Date Noted   Supervision of other normal pregnancy, antepartum 02/23/2024     Concerns addressed today  Delivery Plans Plans to deliver at [facility]. Discussed the nature of our practice with multiple providers including residents and students. Due to the size of the practice, the delivering provider may not be the same as those providing prenatal care.   Patient is not interested in water birth.  MyChart/Babyscripts MyChart access verified. I explained pt will have some visits in office and some virtually. Babyscripts instructions given and order placed. Patient verifies receipt of registration text/e-mail. Account successfully created and app downloaded. If patient is a candidate for Optimized scheduling, add to sticky note.   Blood Pressure Cuff/Weight Scale Blood pressure cuff ordered for patient to pick-up from [employer]. Explained after first prenatal appt pt will check weekly and document in Babyscripts. Patient does not have weight scale; patient may purchase if they desire to track weight weekly in Babyscripts.  Anatomy US  Explained first scheduled US  will be around 19 weeks.  Anatomy US  scheduled for TBD at TBD.  Is patient a candidate for Babyscripts Optimization? Yes, patient accepted    First visit review I reviewed new OB appt with patient. Explained pt will be seen by Jorene Moats, PA at first visit. Discussed [place] genetic screening with patient. Requests Panorama. Routine prenatal labs collected at today's visit.   Last Pap Diagnosis  Date Value Ref Range Status  09/07/2020   Final   - Negative for intraepithelial lesion or malignancy (NILM)    Rocky [name], RN 03/29/2024  11:32 AM

## 2024-03-29 NOTE — Patient Instructions (Signed)

## 2024-03-30 LAB — CBC/D/PLT+RPR+RH+ABO+RUBIGG...
Antibody Screen: NEGATIVE
Basophils Absolute: 0 x10E3/uL (ref 0.0–0.2)
Basos: 0 %
EOS (ABSOLUTE): 0 x10E3/uL (ref 0.0–0.4)
Eos: 0 %
HCV Ab: NONREACTIVE
HIV Screen 4th Generation wRfx: NONREACTIVE
Hematocrit: 39.4 % (ref 34.0–46.6)
Hemoglobin: 12.7 g/dL (ref 11.1–15.9)
Hepatitis B Surface Ag: NEGATIVE
Immature Grans (Abs): 0 x10E3/uL (ref 0.0–0.1)
Immature Granulocytes: 0 %
Lymphocytes Absolute: 1.4 x10E3/uL (ref 0.7–3.1)
Lymphs: 22 %
MCH: 27.8 pg (ref 26.6–33.0)
MCHC: 32.2 g/dL (ref 31.5–35.7)
MCV: 86 fL (ref 79–97)
Monocytes Absolute: 0.2 x10E3/uL (ref 0.1–0.9)
Monocytes: 3 %
Neutrophils Absolute: 4.7 x10E3/uL (ref 1.4–7.0)
Neutrophils: 75 %
Platelets: 228 x10E3/uL (ref 150–450)
RBC: 4.57 x10E6/uL (ref 3.77–5.28)
RDW: 12.7 % (ref 11.7–15.4)
RPR Ser Ql: NONREACTIVE
Rh Factor: POSITIVE
Rubella Antibodies, IGG: 3.04 {index} (ref >0.99)
WBC: 6.4 x10E3/uL (ref 3.4–10.8)

## 2024-03-30 LAB — HEMOGLOBIN A1C
Est. average glucose Bld gHb Est-mCnc: 97 mg/dL
Hgb A1c MFr Bld: 5 % (ref 4.8–5.6)

## 2024-03-30 LAB — HCV INTERPRETATION

## 2024-04-14 ENCOUNTER — Ambulatory Visit: Admitting: Physician Assistant

## 2024-04-14 VITALS — BP 102/70 | HR 88 | Wt 135.0 lb

## 2024-04-14 DIAGNOSIS — Z3A14 14 weeks gestation of pregnancy: Secondary | ICD-10-CM

## 2024-04-14 DIAGNOSIS — Z3482 Encounter for supervision of other normal pregnancy, second trimester: Secondary | ICD-10-CM

## 2024-04-14 DIAGNOSIS — R5383 Other fatigue: Secondary | ICD-10-CM

## 2024-04-14 DIAGNOSIS — Z3480 Encounter for supervision of other normal pregnancy, unspecified trimester: Secondary | ICD-10-CM

## 2024-04-14 NOTE — Progress Notes (Signed)
   PRENATAL VISIT NOTE  Subjective:  Emily Wilkins is a 33 y.o. H5E7987 at [redacted]w[redacted]d being seen today for her first prenatal visit for this pregnancy.  She is currently monitored for the following issues for this low-risk pregnancy and has Supervision of other normal pregnancy, antepartum on their problem list.  Patient reports fatigue.  Contractions: Not present. Vag. Bleeding: None.  Movement: Absent. Denies leaking of fluid.   She is planning to breastfeed. Unsure about desire for contraception.   The following portions of the patient's history were reviewed and updated as appropriate: allergies, current medications, past family history, past medical history, past social history, past surgical history and problem list.   Objective:   Vitals:   04/14/24 1124  BP: 102/70  Pulse: 88  Weight: 135 lb (61.2 kg)    Fetal Status: Fetal Heart Rate (bpm): 155   Movement: Absent     General:  Alert, oriented and cooperative. Patient is in no acute distress.  Skin: Skin is warm and dry. No rash noted.   Cardiovascular: Normal heart rate and rhythm noted  Respiratory: Normal respiratory effort, no problems with respiration noted. Clear to auscultation.   Abdomen: Soft, gravid, appropriate for gestational age. Normal bowel sounds. Non-tender. Pain/Pressure: Present     Pelvic: Cervical exam deferred       Normal cervical contour, no lesions, no bleeding following pap, normal discharge  Extremities: Normal range of motion.  Edema: None  Mental Status: Normal mood and affect. Normal behavior. Normal judgment and thought content.    Indications for ASA therapy (per uptodate) One of the following: Previous pregnancy with preeclampsia, especially early onset and with an adverse outcome No Multifetal gestation No Chronic hypertension No Type 1 or 2 diabetes mellitus No Chronic kidney disease No Autoimmune disease (antiphospholipid syndrome, systemic lupus erythematosus) No  Two or more of the  following: Nulliparity No Obesity (body mass index >30 kg/m2) No Family history of preeclampsia in mother or sister No Age >=35 years No Sociodemographic characteristics (African American race, low socioeconomic level) No Personal risk factors (eg, previous pregnancy with low birth weight or small for gestational age infant, previous adverse pregnancy outcome [eg, stillbirth], interval >10 years between pregnancies) No   Assessment and Plan:  Pregnancy: H5E7987 at [redacted]w[redacted]d  1. Supervision of other normal pregnancy, antepartum (Primary) Encounter for supervision of normal pregnancy, antepartum, unspecified gravidity Initial labs drawn. Continue prenatal vitamins. Ultrasound discussed; fetal anatomic survey: ordered Problem list reviewed and updated. Reviewed Brx optimized schedule, patient agreeable The nature of [facility] with multiple MDs and other Advanced Practice Providers was explained to patient; also emphasized that residents, students are part of our team. Routine obstetric precautions reviewed.   2. [redacted] weeks gestation of pregnancy Anticipatory guidance about next visits/weeks of pregnancy given.   3. Other fatigue - TSH Rfx on Abnormal to Free T4 - Vitamin D  (25 hydroxy)   Preterm labor/first trimester warning symptoms and general obstetric precautions including but not limited to vaginal bleeding, contractions, leaking of fluid and fetal movement were reviewed in detail with the patient.  Please refer to After Visit Summary for other counseling recommendations.   No follow-ups on file.  No future appointments.  Emily Wampole E Debbie Bellucci, PA-C

## 2024-04-14 NOTE — Progress Notes (Signed)
 Pt presents for new ob. Pt has no questions or concerns at this time.

## 2024-04-15 LAB — VITAMIN D 25 HYDROXY (VIT D DEFICIENCY, FRACTURES): Vit D, 25-Hydroxy: 35.8 ng/mL (ref 30.0–100.0)

## 2024-04-15 LAB — TSH RFX ON ABNORMAL TO FREE T4: TSH: 2.34 u[IU]/mL (ref 0.450–4.500)

## 2024-04-19 ENCOUNTER — Ambulatory Visit: Payer: Self-pay | Admitting: Physician Assistant

## 2024-05-12 ENCOUNTER — Other Ambulatory Visit (HOSPITAL_COMMUNITY)
Admission: RE | Admit: 2024-05-12 | Discharge: 2024-05-12 | Disposition: A | Discharge status: Home / Self Care | Source: Ambulatory Visit | Attending: Family Medicine | Admitting: Family Medicine

## 2024-05-12 ENCOUNTER — Ambulatory Visit: Admitting: Family Medicine

## 2024-05-12 ENCOUNTER — Encounter: Payer: Self-pay | Admitting: Family Medicine

## 2024-05-12 VITALS — BP 102/70 | HR 79 | Wt 132.8 lb

## 2024-05-12 DIAGNOSIS — M549 Dorsalgia, unspecified: Secondary | ICD-10-CM | POA: Diagnosis not present

## 2024-05-12 DIAGNOSIS — N898 Other specified noninflammatory disorders of vagina: Secondary | ICD-10-CM | POA: Insufficient documentation

## 2024-05-12 DIAGNOSIS — Z3A18 18 weeks gestation of pregnancy: Secondary | ICD-10-CM | POA: Diagnosis not present

## 2024-05-12 DIAGNOSIS — Z3480 Encounter for supervision of other normal pregnancy, unspecified trimester: Secondary | ICD-10-CM | POA: Diagnosis not present

## 2024-05-12 DIAGNOSIS — Q790 Congenital diaphragmatic hernia: Secondary | ICD-10-CM | POA: Insufficient documentation

## 2024-05-12 NOTE — Patient Instructions (Signed)
 BACK PAIN: Rest Use ice/heat/warm bath Increase PO fluids, aim for 80-100 ounces of fluid intake each day Tylenol is safe to take for pain. Do not take more than 4000 mg total in 24 hours. Pregnancy support belt Pregnancy Yoga: There are free options through YouTube and you can purchase DVDs easily.  Float therapy: This involves soaking in a warm bath of magnesium to help relax muscles. Local places that have this option are [facility] in [place] or [facility] in [facility] Massage therapy: This is safe in pregnancy. Just assure your therapist is trained in prenatal massage. Some local options include [employer] and [facility] Chiropractic care: This involves re-aligning the bones and muscles of the body. You want to make sure the practitioner has training in pregnancy (Webster Method).  A local option is [facility] at [employer] and [employer]. [phone] [email or web address] and there are many other chiropractic offices that do adjustments in pregnancy  BACK PAIN EXERCISES Stomach tone  Lie on your front with your arms by your side, head on one side. Pull in your stomach muscles, centered around your belly button. Hold for five seconds. Repeat three times. Build up to 10 seconds and repeat during the day, while walking or standing. Keep breathing during this exercise!  Pelvic tilt  Lie down with your knees bent. Tighten your stomach muscles, flattening your back against the floor. Hold for five seconds. Repeat five times.  Knee rolls  Lie on your back with your knees bent and your feet together. Roll your knees to one side, keeping your shoulders flat on the bed or floor, and hold for 10 seconds. Roll your knees back to the starting position, and then over to the other side and repeat. Do this exercise three times on each side.  Knees to chest  Lie on your back, with your knees bent and feet flat on the  floor or bed. Bring one knee up and use your hands to pull it gently towards your chest. Hold the leg in position for five seconds, and then relax. Repeat this exercise with the other knee. Do the exercise five times on each side.  Buttock tone  Lie on your front and bend one leg up behind you. Lift your bent knee just off the floor. Hold for up to eight seconds. Repeat five times each side.   Deep stomach muscle tone  Kneel on all fours with a small curve in your lower back. Let your stomach relax completely. Pull the lower part of your stomach upwards so that you lift your back (without arching it) away from the floor. Hold for 10 seconds. Keep breathing! Repeat 10 times.  Back stabilizer  Kneel on all fours with your back straight. Tighten your stomach. Keeping your back in this position, raise one arm in front of you and hold for 10 seconds. Try to keep your pelvis level and don't rotate your body. Repeat 10 times each side. To progress, try lifting one leg behind you instead of raising your arm.  Leg raise  Lie face down, though you might want to turn your head to one side if this is more comfortable. Tighten your stomach and buttock muscles to lift one leg slightly off the floor, while keeping your hips flat on the ground. Hold this position for 5 to 10 seconds and repeat 3 times.   Arm raise  Lie on your stomach with your back in a neutral position. Tense  the muscles in your lower stomach and raise one arm upwards. Hold this position for five seconds, and then relax your arm. Repeat this exercise 10 times with each arm.  Hamstring stretch  Steady yourself, then put one leg up on a chair. Keeping your raised leg straight, bend the supporting knee forward to stretch your hamstrings. Repeat three times each side. Please note: For those with acute sciatica this hamstring stretch may also pull on the sciatic nerve, making it feel worse. If in doubt, ask a physiotherapist if this  exercise is suitable for you.  One-leg stand  Steady yourself with one hand on a wall or work surface for support. Bend one leg up behind you. Hold your foot for 10 seconds and repeat three times each side. Try to keep your knees and thighs level with one another.  Deep lunge  Kneel on one knee, the other foot in front. Lift your back knee up, making sure you keep looking forwards. Push your hips forward. Hold for five seconds and repeat three times each side. Try to keep your upper body upright, avoid bending or leaning your upper body forwards.

## 2024-05-12 NOTE — Addendum Note (Signed)
 Addended by: Emery Binz V on: 05/12/2024 04:36 PM   Modules accepted: Orders

## 2024-05-12 NOTE — Progress Notes (Signed)
 Pt presents for ROB visit. Pt reports abd cramping.  Pt c/o vaginal irritation for 3 days

## 2024-05-12 NOTE — Progress Notes (Signed)
   PRENATAL VISIT NOTE  Subjective:  Emily Wilkins is a 33 y.o. H5E7987 at [redacted]w[redacted]d being seen today for ongoing prenatal care.  She is currently monitored for the following issues for this low-risk pregnancy and has Supervision of other normal pregnancy, antepartum and Back pain affecting pregnancy on their problem list.  Patient reports vaginal irritation x3 days and occasional abdominal cramps every few days.  Contractions: Not present. Vag. Bleeding: None.  Movement: Present. Denies leaking of fluid.   The following portions of the patient's history were reviewed and updated as appropriate: allergies, current medications, past family history, past medical history, past social history, past surgical history and problem list.   Objective:    Vitals:   05/12/24 1132  BP: 102/70  Pulse: 79  Weight: 132 lb 12.8 oz (60.2 kg)    Fetal Status:  Fetal Heart Rate (bpm): 146   Movement: Present    General: Alert, oriented and cooperative. Patient is in no acute distress.  Skin: Skin is warm and dry. No rash noted.   Cardiovascular: Normal heart rate noted  Respiratory: Normal respiratory effort, no problems with respiration noted  Abdomen: Soft, gravid, appropriate for gestational age.  Pain/Pressure: Present     Pelvic: Cervical exam deferred        Extremities: Normal range of motion.  Edema: None  Mental Status: Normal mood and affect. Normal behavior. Normal judgment and thought content.   Assessment and Plan:  Pregnancy: H5E7987 at [redacted]w[redacted]d 1. Supervision of other normal pregnancy, antepartum (Primary) Prenatal course reviewed BP, HR, FHR within normal limits - AFP, Serum, Open Spina Bifida  2. [redacted] weeks gestation of pregnancy AFP today. Anatomy US  ordered, not yet scheduled. Message sent to office staff to schedule US . - AFP, Serum, Open Spina Bifida  3. Vaginal irritation Vaginal swabs to rule out infection. Declines speculum exam.   4. Back pain affecting pregnancy Recommend  conservative measures to start.   Preterm labor symptoms and general obstetric precautions including but not limited to vaginal bleeding, contractions, leaking of fluid and fetal movement were reviewed in detail with the patient. Please refer to After Visit Summary for other counseling recommendations.   Return in about 4 weeks (around 06/09/2024) for LOB.  No future appointments.  Joesph DELENA Sear, PA

## 2024-05-14 LAB — AFP, SERUM, OPEN SPINA BIFIDA
AFP MoM: 0.93
AFP Value: 43.7 ng/mL
Gest. Age on Collection Date: 18 wk
Maternal Age At EDD: 31.6 a
OSBR Risk 1 IN: 10000
Test Results:: NEGATIVE
Weight: 132 [lb_av]

## 2024-05-15 ENCOUNTER — Ambulatory Visit: Payer: Self-pay | Admitting: Family Medicine

## 2024-05-15 DIAGNOSIS — Z3480 Encounter for supervision of other normal pregnancy, unspecified trimester: Secondary | ICD-10-CM

## 2024-05-16 LAB — CERVICOVAGINAL ANCILLARY ONLY
Bacterial Vaginitis (gardnerella): NEGATIVE
Candida Glabrata: NEGATIVE
Candida Vaginitis: NEGATIVE
Comment: NEGATIVE
Comment: NEGATIVE
Comment: NEGATIVE

## 2024-06-09 ENCOUNTER — Encounter: Payer: Self-pay | Admitting: Physician Assistant

## 2024-06-09 ENCOUNTER — Ambulatory Visit (INDEPENDENT_AMBULATORY_CARE_PROVIDER_SITE_OTHER): Admitting: Physician Assistant

## 2024-06-09 VITALS — BP 106/70 | HR 87 | Wt 138.6 lb

## 2024-06-09 DIAGNOSIS — Z3A22 22 weeks gestation of pregnancy: Secondary | ICD-10-CM

## 2024-06-09 DIAGNOSIS — Z3480 Encounter for supervision of other normal pregnancy, unspecified trimester: Secondary | ICD-10-CM

## 2024-06-09 NOTE — Progress Notes (Signed)
 Pt presents for ROB visit. Does not want to start Zoloft . No concerns

## 2024-06-09 NOTE — Progress Notes (Signed)
   PRENATAL VISIT NOTE  Subjective:  Emily Wilkins is a 33 y.o. H5E7987 at [redacted]w[redacted]d being seen today for ongoing prenatal care.  She is currently monitored for the following issues for this low-risk pregnancy and has Supervision of other normal pregnancy, antepartum and Back pain affecting pregnancy on their problem list.  Patient reports no complaints.  Contractions: Not present. Vag. Bleeding: None.  Movement: Present. Denies leaking of fluid.   The following portions of the patient's history were reviewed and updated as appropriate: allergies, current medications, past family history, past medical history, past social history, past surgical history and problem list.   Objective:    Vitals:   06/09/24 0950  BP: 106/70  Pulse: 87  Weight: 138 lb 9.6 oz (62.9 kg)    Fetal Status:  Fetal Heart Rate (bpm): 150 Fundal Height: 22 cm Movement: Present    General: Alert, oriented and cooperative. Patient is in no acute distress.  Skin: Skin is warm and dry. No rash noted.   Cardiovascular: Normal heart rate noted  Respiratory: Normal respiratory effort, no problems with respiration noted  Abdomen: Soft, gravid, appropriate for gestational age.  Pain/Pressure: Absent     Pelvic: Cervical exam deferred        Extremities: Normal range of motion.  Edema: None  Mental Status: Normal mood and affect. Normal behavior. Normal judgment and thought content.   Assessment and Plan:  Pregnancy: H5E7987 at [redacted]w[redacted]d  1. Supervision of other normal pregnancy, antepartum (Primary) Patient doing well, feeling regular fetal movement BP, FHR, FH appropriate   2. [redacted] weeks gestation of pregnancy Anticipatory guidance about next visits/weeks of pregnancy given.   Preterm labor symptoms and general obstetric precautions including but not limited to vaginal bleeding, contractions, leaking of fluid and fetal movement were reviewed in detail with the patient.  Please refer to After Visit Summary for other  counseling recommendations.   Return in about 4 weeks (around 07/07/2024) for LOB+GTT.  Future Appointments  Date Time Provider Department Center  06/20/2024  9:00 AM [facility] PROVIDER 1 [facility]  06/20/2024  9:30 AM [facility] US4 [facility]  07/07/2024  9:00 AM [facility] None  07/07/2024  9:35 AM Wilborn Membreno E, PA-C [facility] None  07/21/2024 11:15 AM Elianna Windom E, PA-C [facility] None    Yee Gangi E Sundai Probert, PA-C

## 2024-06-20 ENCOUNTER — Ambulatory Visit: Attending: Obstetrics & Gynecology | Admitting: Obstetrics

## 2024-06-20 ENCOUNTER — Other Ambulatory Visit: Payer: Self-pay | Admitting: Obstetrics & Gynecology

## 2024-06-20 ENCOUNTER — Ambulatory Visit

## 2024-06-20 ENCOUNTER — Ambulatory Visit (HOSPITAL_BASED_OUTPATIENT_CLINIC_OR_DEPARTMENT_OTHER): Admitting: *Deleted

## 2024-06-20 ENCOUNTER — Other Ambulatory Visit: Payer: Self-pay | Admitting: *Deleted

## 2024-06-20 ENCOUNTER — Ambulatory Visit (HOSPITAL_BASED_OUTPATIENT_CLINIC_OR_DEPARTMENT_OTHER)

## 2024-06-20 VITALS — BP 100/53 | HR 90

## 2024-06-20 DIAGNOSIS — O283 Abnormal ultrasonic finding on antenatal screening of mother: Secondary | ICD-10-CM

## 2024-06-20 DIAGNOSIS — O99891 Other specified diseases and conditions complicating pregnancy: Secondary | ICD-10-CM

## 2024-06-20 DIAGNOSIS — Z3689 Encounter for other specified antenatal screening: Secondary | ICD-10-CM

## 2024-06-20 DIAGNOSIS — O35FXX0 Maternal care for other (suspected) fetal abnormality and damage, fetal musculoskeletal anomalies of trunk, not applicable or unspecified: Secondary | ICD-10-CM

## 2024-06-20 DIAGNOSIS — Z3A24 24 weeks gestation of pregnancy: Secondary | ICD-10-CM | POA: Diagnosis not present

## 2024-06-20 DIAGNOSIS — O321XX0 Maternal care for breech presentation, not applicable or unspecified: Secondary | ICD-10-CM | POA: Diagnosis present

## 2024-06-20 DIAGNOSIS — Q790 Congenital diaphragmatic hernia: Secondary | ICD-10-CM | POA: Insufficient documentation

## 2024-06-20 DIAGNOSIS — Z3480 Encounter for supervision of other normal pregnancy, unspecified trimester: Secondary | ICD-10-CM

## 2024-06-20 DIAGNOSIS — Z368A Encounter for antenatal screening for other genetic defects: Secondary | ICD-10-CM | POA: Insufficient documentation

## 2024-06-20 DIAGNOSIS — O3680X0 Pregnancy with inconclusive fetal viability, not applicable or unspecified: Secondary | ICD-10-CM

## 2024-06-20 NOTE — Procedures (Signed)
 Emily Wilkins February 18, 1991 [redacted]w[redacted]d  Fetus A Non-Stress Test Interpretation for 06/20/24  Indication: Fetal anomaly, s/p amnio  Fetal Heart Rate A Mode: External Baseline Rate (A): 140 bpm Variability: Moderate Accelerations: 10 x 10 Decelerations: None  Uterine Activity Mode: Palpation, Toco Contraction Frequency (min): None Resting Tone Palpated: Relaxed Resting Time: Adequate  Interpretation (Fetal Testing) Nonstress Test Interpretation: Reactive Overall Impression: Reassuring for gestational age Comments: Pt s/p amnio. Dr. Ileana reviewed tracing.

## 2024-06-20 NOTE — Progress Notes (Signed)
 MFM Consult Note  Emily Wilkins is currently at 24 weeks and 0 days.  She was seen for a detailed fetal anatomy scan.  She denies any significant past medical history and denies any problems in her current pregnancy.    She had a cell free DNA test earlier in her pregnancy which indicated a low risk for trisomy 41, 65, and 13. A female fetus is predicted.   Sonographic findings Single intrauterine pregnancy at 24w 0d  Fetal cardiac activity:  Observed and appears normal. Presentation: Breech. Fetal biometry shows the estimated fetal weight of 1 pound 8 ounces which measures at the 57th percentile.  Amniotic fluid: Within normal limits.  MVP: 5.35 cm. Placenta: Posterior Fundal. Adnexa: No abnormality visualized. Cervical length: 3.5 cm.  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  Loops of bowel were noted in the fetal chest.  The loops of bowel are shifting the heart to the right side of the chest.  A normal-appearing left sided fetal stomach is noted within the abdomen.  The patient was advised that a congenital diaphragmatic hernia occurs when the diaphragm which separates the chest from the abdomen fails to close during prenatal development.  This opening allows contents of the abdomen (stomach, intestines and/or liver) to migrate into the chest impacting the growth and development of the lungs.    As the abdominal contents are occupying space where the lungs should be developing, the lungs may be smaller than expected (pulmonary hypoplasia) and will have less developed blood vessels.  The prognosis for babies born with a congenital diaphragmatic hernia will depend on the volume of lung tissue present after birth and also the presence of pulmonary hypertension.  The cause of congenital diaphragmatic hernia is unknown.  Typically congenital diaphragmatic hernia is an isolated finding although it can occur  together with a congenital heart defect or a genetic abnormality which can lead to additional complications.  Due to the congenital diaphragmatic hernia noted today, the was offered an amniocentesis for definitive diagnosis of fetal genetic abnormalities.  The patient wanted the amniocentesis to be performed today.    After informed consent was obtained, a timeout was performed verifying the patient's identity and the indications for the procedure.  The patient was then prepped and draped in the usual sterile fashion.  An uncomplicated genetic amniocentesis was performed today obtaining 40 cc of straw-colored amniotic fluid which was then sent off for amniotic fluid AFP, chromosome analysis, and genome sequencing.  The patient was advised that our genetic counselor will notify her regarding the results of the amniocentesis.    Post amniocentesis instructions were discussed.  As the patient's blood type is Rh positive, a dose of RhoGam was not given following the procedure.  As the fetus is considered viable at this time, she had an NST that was reactive for her gestational age following the amniocentesis procedure.  She was advised that she will have to deliver at a center where pediatric surgery and extracorporeal membrane oxygenation (ECMO) services are available such as [facility].    I will order a fetal MRI for the patient so that the total lung volume can be calculated.  The MRI may help to determine the severity of congenital diaphragmatic hernia before delivery.   A referral was made to [facility] pediatric cardiology for a fetal echocardiogram.  I will make a referral for the patient to be seen with MFM, pediatric surgery, and the NICU team  at [facility] so that appropriate preparations can be made for her to transfer her care and deliver there.    A follow-up exam was scheduled in 4 weeks for fetal assessment.    Due to the increased risk of fetal demise in fetuses with congenital anomalies,  we will start weekly fetal testing at around 32 weeks.  The patient stated that all of her questions were answered today.  A total of 60 minutes was spent counseling and coordinating the care for this patient.  Greater than 50% of the time was spent in direct face-to-face contact.

## 2024-06-20 NOTE — Progress Notes (Signed)
 [facility] at [facility] [address], [address] Phone:  [phone]   Fax:  [phone]      In-Person Genetic Counseling Clinic Note:   I spoke with 33 y.o. Emily Wilkins today to discuss her ultrasound findings. She was referred by Eveline Lynwood MATSU, MD.   Pregnancy/Family History:   A detailed personal/family history was not taken today. [id number]. EGA: [redacted]w[redacted]d by US . EDD: 10/10/2024. Izel has two healthy sons. She had one early SAB of unknown etiology.  Karrah reports FOB's sister has a daughter with cerebral palsy. Cerebral Palsy may be due to a number of factors including genetics, maternal infections, fetal stroke, bleeding into the brain, infant infections, traumatic head injury, multiple gestations, low birth weight, lack of oxygen to the brain, etc. We are unable to test for cerebral palsy in pregnancy and an accurate recurrence risk cannot be quoted due to its multifactorial etiology.   Patient ethnicity reported as [place] and FOB ethnicity reported as [place]. Denies Ashkenazi Jewish ancestry.  Family history otherwise not remarkable for consanguinity, individuals with birth defects, intellectual disability, autism spectrum disorder, multiple spontaneous abortions, still births, or unexplained neonatal death.   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 This is most likely a left-sided [facility] with herniation of the small bowel into the left chest. Please see ultrasound report for details.   We reviewed information about congenital diaphragmatic hernias [facility]). The diaphragm is a thin curved muscle layer that separates the abdominal cavity from the chest cavity. This muscle aids in respiration by expanding and contracting, allowing an individual to inhale and exhale. The diaphragm closes at approximately 10-12 weeks' gestation.  Incomplete closure of the diaphragm leads to an opening between the abdominal and chest cavities, which may result in herniation of abdominal organs (stomach, intestines, and/or liver) into the chest cavity. When this occurs, it is referred to as a [facility]. Most cases (~80%) of [facility] are left-sided.   We reviewed that prognosis will depend on factors such as the severity of the [facility], degree of pulmonary hypoplasia, the presence of liver in the fetal chest, polyhydramnios, other anatomical differences such as a heart defect, and associated genetic or chromosome findings.    Delivery at a tertiary care center is recommended in cases of [facility] due to access to neonatology, pediatric surgery, and extracorporeal membrane oxygenation (ECMO). Moreover, experimental in utero repair by fetoscopic tracheal occlusion (FETO) for [facility] is available at certain hospital systems, such as [employer]. This is available to patients who meet certain criteria, including having an anatomically and chromosomally typical fetus (necessitating amniocentesis), the [facility] being left-sided with the liver herniated, and the infant having severe pulmonary hypoplasia. Additionally, a family must be able to remain close geographically to the FETO site during the time period of tracheal occlusion until delivery, necessitating relocation and time off of work for an extended period.   We discussed that around half of [facility] cases are isolated, and around 15-30% of [facility] cases are due to a chromosomal or genetic condition. [facility] can be associated with Down syndrome (trisomy 72), trisomy 66, and isochromosome 12p (Pallister-Killian syndrome). Single gene conditions include Fryns syndrome, Cornelia de Lange syndrome, and Donnai-Barrow syndrome.  We reviewed that diagnostic testing is the only way to definitively test for genetic conditions prenatally. We reviewed genes, chromosomes, and inheritance patterns. We discussed available prenatal diagnostic testing  options from amniocentesis including chromosomal analysis and genome sequencing. We reviewed the technical aspects, benefits, risks, and limitations of these tests, including the consent form for [facility] prenatal genome. The patient wishes to obtain as much information as possible. Amniocentesis was performed today by Dr. Ileana, MFM. We ordered karyotype and AF-AFP through [facility] as well as trio prenatal genome through [facility] (FOB will call to schedule a lab visit for his blood draw). The patient opted out of [facility] secondary findings and opted in for research participation for prenatal genome. We also discussed genetic discrimination laws (GINA) that apply to health insurance and employers with greater than 15 employees but do not apply to life and long term care and disability insurance.   We reviewed that if a chromosomal or single gene condition is detected, this can help inform us  of additional clinical features, prognosis, and next available steps. We reviewed that this can allow them to connect with different specialists for the care of the newborn, and it can provide additional information to assist in the decision of continuation of the pregnancy or termination.   Previous Testing Completed:  Low risk NIPS: Marilena previously completed [facility] noninvasive prenatal screening (NIPS) in this pregnancy. The result is low risk, consistent with a female fetus. This screening significantly reduces but does not eliminate the chance that the current pregnancy has Down syndrome (trisomy 52), trisomy 72, trisomy 43, common sex chromosome conditions, and 22q11.2 microdeletion syndrome. Please see report for details. There are many genetic conditions that cannot be detected by NIPS.   Negative carrier screening: Suzan previously completed [facility] carrier screening. She screened to not be a carrier for cystic fibrosis (CF), spinal muscular atrophy (SMA), alpha thalassemia, and beta hemoglobinopathies. Please see  report for details. A negative result on carrier screening reduces but does not eliminate the chance of being a carrier.   Negative ms-AFP screening: Tarryn previously completed a maternal serum AFP screen in this pregnancy. The result is screen negative. Please see report for details. A negative result reduces the risk that the current pregnancy has an open neural tube defect. Closed neural tube defects and some open defects may not be detected by this screen.   Plan of Care:   Amniocentesis performed today. Karyotype and AF-AFP ordered through [facility]. Trio prenatal genome sequencing ordered through [facility] (FOB to call and schedule a lab visit for a blood draw for trio sequencing). Maternal cell contamination drawn. Referral sent to [facility]. Added to [facility] list.   Informed consent was obtained. All questions were answered.   110 minutes were spent on the date of the encounter in service to the patient including preparation (30 min), face-to-face consultation (40 min), discussion of test reports and available next steps, genetic risk assessment, documentation (40 min), and care coordination.    Thank you for sharing in the care of Caprice with us .  Please do not hesitate to contact us  at [phone] if you have any questions.   Lauraine Bodily, MS, [facility] Certified Genetic Counselor   Genetic counseling student involved in appointment: No.

## 2024-06-23 DIAGNOSIS — O35FXX0 Maternal care for other (suspected) fetal abnormality and damage, fetal musculoskeletal anomalies of trunk, not applicable or unspecified: Secondary | ICD-10-CM | POA: Insufficient documentation

## 2024-06-28 ENCOUNTER — Telehealth: Payer: Self-pay

## 2024-06-28 NOTE — Telephone Encounter (Signed)
 I spoke with the patient to inform her that [facility]'s policy is to proceed with duo testing as the paternal sample was not collected/received. Lesieli's husband was scheduled to come for a blood draw yesterday to send to [facility] but had to cancel the appointment. Tovah previously declined a saliva kit to be sent to their address. She is okay with this proceeding as a duo test (maternal and fetal analysis) as she does not know when her husband would  be available to provide a blood sample.  We also reviewed the AF-AFP was wnl. All other testing is pending.  Lauraine Bodily, MS, [facility] Certified Genetic Counselor [facility] for Maternal Fetal [facility] [phone]

## 2024-06-30 LAB — MCC TRACKING

## 2024-07-06 ENCOUNTER — Telehealth: Payer: Self-pay

## 2024-07-06 LAB — MCC TRACKING

## 2024-07-06 NOTE — Telephone Encounter (Signed)
 I spoke with the patient to return part of her amniocentesis results.  AF-AFP wnl.  Karyotype is normal 46, XY. No aneuploidies detected.  Prenatal genome sequencing is pending.  Lauraine Bodily, MS, [facility] Certified Genetic Counselor [facility] for Maternal Fetal Care [phone]

## 2024-07-07 ENCOUNTER — Encounter: Payer: Self-pay | Admitting: Physician Assistant

## 2024-07-07 ENCOUNTER — Other Ambulatory Visit

## 2024-07-07 LAB — MATERNAL CELL CONTAMINATION

## 2024-07-07 LAB — AFP, AMNIOTIC FLUID
AFP, Amniotic Fluid (mcg/ml): 2.7 ug/mL
Gestational Age(Wks): 24
MOM, Amniotic Fluid: 0.75

## 2024-07-07 LAB — CHROMOSOME, AMNIOTIC FLUID
Cells Analyzed: 15
Cells Counted: 15
Cells Karyotyped: 2
Colonies: 15
GTG Band Resolution Achieved: 450

## 2024-07-12 ENCOUNTER — Encounter: Payer: Self-pay | Admitting: Obstetrics and Gynecology

## 2024-07-12 ENCOUNTER — Encounter: Payer: Self-pay | Admitting: Physician Assistant

## 2024-07-12 ENCOUNTER — Other Ambulatory Visit

## 2024-07-12 DIAGNOSIS — Z3480 Encounter for supervision of other normal pregnancy, unspecified trimester: Secondary | ICD-10-CM

## 2024-07-12 DIAGNOSIS — Z3A27 27 weeks gestation of pregnancy: Secondary | ICD-10-CM

## 2024-07-13 NOTE — Telephone Encounter (Signed)
 Attempted to call patient with prenatal genome sequencing results. Left message with callback number.

## 2024-07-14 ENCOUNTER — Encounter: Payer: Self-pay | Admitting: Physician Assistant

## 2024-07-14 ENCOUNTER — Ambulatory Visit (INDEPENDENT_AMBULATORY_CARE_PROVIDER_SITE_OTHER): Payer: Self-pay | Admitting: Physician Assistant

## 2024-07-14 ENCOUNTER — Other Ambulatory Visit

## 2024-07-14 VITALS — BP 100/65 | HR 80 | Wt 147.0 lb

## 2024-07-14 DIAGNOSIS — Z23 Encounter for immunization: Secondary | ICD-10-CM

## 2024-07-14 DIAGNOSIS — O35FXX0 Maternal care for other (suspected) fetal abnormality and damage, fetal musculoskeletal anomalies of trunk, not applicable or unspecified: Secondary | ICD-10-CM

## 2024-07-14 DIAGNOSIS — Z3A27 27 weeks gestation of pregnancy: Secondary | ICD-10-CM

## 2024-07-14 DIAGNOSIS — Z3480 Encounter for supervision of other normal pregnancy, unspecified trimester: Secondary | ICD-10-CM

## 2024-07-14 NOTE — Progress Notes (Signed)
 PRENATAL VISIT NOTE  Subjective:  Emily Wilkins is a 33 y.o. Emily Wilkins at [redacted]w[redacted]d being seen today for ongoing prenatal care.  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  Patient reports no complaints.  Contractions: Not present. Vag. Bleeding: None.  Movement: Present. Denies leaking of fluid.   The following portions of the patient's history were reviewed and updated as appropriate: allergies, current medications, past family history, past medical history, past social history, past surgical history and problem list.   Objective:   Vitals:   07/14/24 0845  BP: 100/65  Pulse: 80  Weight: 147 lb (66.7 kg)    Fetal Status:  Fetal Heart Rate (bpm): 131   Movement: Present    General: Alert, oriented and cooperative. Patient is in no acute distress.  Skin: Skin is warm and dry. No rash noted.   Cardiovascular: Normal heart rate noted  Respiratory: Normal respiratory effort, no problems with respiration noted  Abdomen: Soft, gravid, appropriate for gestational age.  Pain/Pressure: Absent     Pelvic: Cervical exam deferred        Extremities: Normal range of motion.  Edema: None  Mental Status: Normal mood and affect. Normal behavior. Normal judgment and thought content.      07/14/2024    8:48 AM 04/14/2024    1:52 PM 03/29/2024   12:00 PM  Depression screen PHQ 2/9  Decreased Interest 0 0 0  Down, Depressed, Hopeless 0 0 0  PHQ - 2 Score 0 0 0  Altered sleeping 0 1 0  Tired, decreased energy 0 1 0  Change in appetite 0 2 0  Feeling bad or failure about yourself  0 0 0  Trouble concentrating 0 1 0  Moving slowly or fidgety/restless 0 0 0  Suicidal thoughts 0 0 0  PHQ-9 Score 0 5  0      Data saved with a  previous flowsheet row definition        07/14/2024    8:48 AM 04/14/2024    1:52 PM 03/29/2024   11:59 AM 11/25/2022    1:33 PM  GAD 7 : Generalized Anxiety Score  Nervous, Anxious, on Edge 0 1 1 0  Control/stop worrying 0 0 0 0  Worry too much - different things 0 1 0 0  Trouble relaxing 0 1 1 0  Restless 0 1 1 0  Easily annoyed or irritable 0 1 2 0  Afraid - awful might happen 0 0 0 0  Total GAD 7 Score 0 5 5 0    Assessment and Plan:  Pregnancy: Emily Wilkins at [redacted]w[redacted]d  1. Supervision of other normal pregnancy, antepartum (Primary) Patient doing well, feeling regular fetal movement BP, FHR, FH appropriate   2. [redacted] weeks gestation of pregnancy Anticipatory guidance about next visits/weeks of pregnancy given.   3. Diaphragmatic hernia of fetus affecting antepartum care of mother, single or unspecified fetus Following MFM + genetics Amniotic fluid AFP not elevated Normal karyotype Genome sequencing result pending Referral to [facility] placed- Fetal MRI done 11/10  Weekly antenatal testing at 32 weeks  Preterm labor symptoms and general obstetric precautions including but not limited to vaginal bleeding, contractions, leaking of fluid and fetal movement were reviewed in detail with the patient.  Please refer to After Visit Summary for other  counseling recommendations.   Return in about 2 weeks (around 07/28/2024).  Future Appointments  Date Time Provider Department Center  07/21/2024  8:15 AM [facility] PROVIDER 1 [facility]  07/21/2024  8:30 AM [facility] US2 [facility]  07/21/2024 11:15 AM Brady Plant E, PA-C [facility] None    Nylani Michetti E Henrick Mcgue, PA-C

## 2024-07-15 LAB — GLUCOSE TOLERANCE, 2 HOURS W/ 1HR
Glucose, 1 hour: 118 mg/dL (ref 70–179)
Glucose, 2 hour: 93 mg/dL (ref 70–152)
Glucose, Fasting: 74 mg/dL (ref 70–91)

## 2024-07-15 LAB — RPR: RPR Ser Ql: NONREACTIVE

## 2024-07-15 LAB — CBC
Hematocrit: 34.7 % (ref 34.0–46.6)
Hemoglobin: 10.9 g/dL — ABNORMAL LOW (ref 11.1–15.9)
MCH: 26 pg — ABNORMAL LOW (ref 26.6–33.0)
MCHC: 31.4 g/dL — ABNORMAL LOW (ref 31.5–35.7)
MCV: 83 fL (ref 79–97)
Platelets: 199 x10E3/uL (ref 150–450)
RBC: 4.2 x10E6/uL (ref 3.77–5.28)
RDW: 12.3 % (ref 11.7–15.4)
WBC: 7.9 x10E3/uL (ref 3.4–10.8)

## 2024-07-15 LAB — HIV ANTIBODY (ROUTINE TESTING W REFLEX): HIV Screen 4th Generation wRfx: NONREACTIVE

## 2024-07-17 ENCOUNTER — Ambulatory Visit: Payer: Self-pay | Admitting: Physician Assistant

## 2024-07-17 ENCOUNTER — Encounter: Payer: Self-pay | Admitting: Physician Assistant

## 2024-07-17 DIAGNOSIS — O99019 Anemia complicating pregnancy, unspecified trimester: Secondary | ICD-10-CM | POA: Insufficient documentation

## 2024-07-18 ENCOUNTER — Other Ambulatory Visit: Payer: Self-pay

## 2024-07-18 MED ORDER — FERROUS SULFATE 325 (65 FE) MG PO TABS: 325.0000 mg | ORAL_TABLET | ORAL | 3 refills | Status: DC

## 2024-07-19 ENCOUNTER — Encounter: Payer: Self-pay | Admitting: Obstetrics

## 2024-07-19 NOTE — Telephone Encounter (Addendum)
 Patient called back. We reviewed all her results from her recent amniocentesis procedure.  [facility] duo prenatal genome sequencing returned as negative. No copy number variants or pathogenic variants in genes related to congenital diaphragmatic hernia or other severe early-onset genetic conditions were identified. This does not entirely exclude the presence of a genetic condition in the fetus. The report is scanned into her chart.  Karyotype was 65, XY. No aneuploidies were detected.  AF-AFP wnl.  Lauraine Bodily, MS, [facility] Certified Genetic Counselor [facility] for Maternal Fetal Care [phone]

## 2024-07-19 NOTE — Telephone Encounter (Signed)
 Attempted to call patient with prenatal genome sequencing results. Left message with callback number.

## 2024-07-21 ENCOUNTER — Encounter: Admitting: Physician Assistant

## 2024-07-21 ENCOUNTER — Ambulatory Visit

## 2024-08-09 ENCOUNTER — Ambulatory Visit: Admitting: Obstetrics

## 2024-08-09 ENCOUNTER — Encounter: Payer: Self-pay | Admitting: Obstetrics

## 2024-08-09 VITALS — BP 106/76 | HR 91 | Wt 153.1 lb

## 2024-08-09 DIAGNOSIS — Z3A31 31 weeks gestation of pregnancy: Secondary | ICD-10-CM

## 2024-08-09 DIAGNOSIS — O0993 Supervision of high risk pregnancy, unspecified, third trimester: Secondary | ICD-10-CM

## 2024-08-09 DIAGNOSIS — O99891 Other specified diseases and conditions complicating pregnancy: Secondary | ICD-10-CM

## 2024-08-09 DIAGNOSIS — M549 Dorsalgia, unspecified: Secondary | ICD-10-CM

## 2024-08-09 DIAGNOSIS — O99013 Anemia complicating pregnancy, third trimester: Secondary | ICD-10-CM

## 2024-08-09 DIAGNOSIS — Q790 Congenital diaphragmatic hernia: Secondary | ICD-10-CM

## 2024-08-09 DIAGNOSIS — O0990 Supervision of high risk pregnancy, unspecified, unspecified trimester: Secondary | ICD-10-CM

## 2024-08-09 MED ORDER — CYCLOBENZAPRINE HCL 10 MG PO TABS: 5.0000 mg | ORAL_TABLET | Freq: Three times a day (TID) | ORAL | 2 refills | Status: AC | PRN

## 2024-08-09 MED ORDER — ACCRUFER 30 MG PO CAPS: 1.0000 | ORAL_CAPSULE | Freq: Two times a day (BID) | ORAL | 3 refills | Status: AC

## 2024-08-09 NOTE — Progress Notes (Signed)
 Subjective:  Emily Wilkins is a 33 y.o. H5E7987 at [redacted]w[redacted]d being seen today for ongoing prenatal care.  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  Patient reports backache and headache.  Contractions: Not present. Vag. Bleeding: None.  Movement: Present. Denies leaking of fluid.   The following portions of the patient's history were reviewed and updated as appropriate: allergies, current medications, past family history, past medical history, past social history, past surgical history and problem list. Problem list updated.  Objective:   Vitals:   08/09/24 1556  BP: 106/76  Pulse: 91  Weight: 153 lb 1.6 oz (69.4 kg)    Fetal Status: Fetal Heart Rate (bpm): 138   Movement: Present     General:  Alert, oriented and cooperative. Patient is in no acute distress.  Skin: Skin is warm and dry. No rash noted.   Cardiovascular: Normal heart rate noted  Respiratory: Normal respiratory effort, no problems with respiration noted  Abdomen: Soft, gravid, appropriate for gestational age. Pain/Pressure: Absent     Pelvic:  Cervical exam deferred        Extremities: Normal range of motion.  Edema: None  Mental Status: Normal mood and affect. Normal behavior. Normal judgment and thought content.   Urinalysis:      Assessment and Plan:  Pregnancy: H5E7987 at [redacted]w[redacted]d  1. Supervision of other normal pregnancy, antepartum (Primary)  2. 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 Anemia affecting pregnancy in third trimester - D/C FeSO4 Rx: - Ferric Maltol  (ACCRUFER ) 30 MG CAPS; Take 1 capsule (30 mg total) by mouth 2 (two) times  daily before a meal. Take 2 hrs before, or 2 hrs after a meal.  Dispense: 60 capsule; Refill: 3  4. Backache symptom - a Maternity Belt recommended Rx: - cyclobenzaprine  (FLEXERIL ) 10 MG tablet; Take 0.5-1 tablets (5-10 mg total) by mouth every 8 (eight) hours as needed for muscle spasms.  Dispense: 30 tablet; Refill: 2   Preterm labor symptoms and general obstetric precautions including but not limited to vaginal bleeding, contractions, leaking of fluid and fetal movement were reviewed in detail with the patient. Please refer to After Visit Summary for other counseling recommendations.   Return in about 2 weeks (around 08/23/2024) for [facility].   Rudy Carlin LABOR, MD 08/09/2024

## 2024-08-09 NOTE — Progress Notes (Signed)
 Pt presents for rob. Pt has no questions or concerns at this time.

## 2024-08-12 ENCOUNTER — Ambulatory Visit

## 2024-08-19 ENCOUNTER — Ambulatory Visit: Attending: Maternal & Fetal Medicine | Admitting: Maternal & Fetal Medicine

## 2024-08-19 ENCOUNTER — Ambulatory Visit

## 2024-08-19 VITALS — BP 109/66 | HR 81

## 2024-08-19 DIAGNOSIS — Z3689 Encounter for other specified antenatal screening: Secondary | ICD-10-CM

## 2024-08-19 DIAGNOSIS — O35FXX0 Maternal care for other (suspected) fetal abnormality and damage, fetal musculoskeletal anomalies of trunk, not applicable or unspecified: Secondary | ICD-10-CM

## 2024-08-19 DIAGNOSIS — Q790 Congenital diaphragmatic hernia: Secondary | ICD-10-CM | POA: Insufficient documentation

## 2024-08-19 DIAGNOSIS — Z362 Encounter for other antenatal screening follow-up: Secondary | ICD-10-CM | POA: Insufficient documentation

## 2024-08-19 DIAGNOSIS — Z3A32 32 weeks gestation of pregnancy: Secondary | ICD-10-CM | POA: Insufficient documentation

## 2024-08-19 DIAGNOSIS — O403XX0 Polyhydramnios, third trimester, not applicable or unspecified: Secondary | ICD-10-CM | POA: Diagnosis not present

## 2024-08-19 DIAGNOSIS — O99891 Other specified diseases and conditions complicating pregnancy: Secondary | ICD-10-CM | POA: Insufficient documentation

## 2024-08-19 NOTE — Progress Notes (Signed)
 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 H5E7987 at [redacted]w[redacted]d here for ultrasound and consultation. Athalie Mosqueda is doing well today with no acute concerns. Today we focused on the following:   The patient is here for antenatal testing for a congenital diaphragmatic hernia that is on the left side.  Today the ultrasound shows normal antenatal testing and the LHR is at the 35th percentile which indicates moderate [facility], indicating a better prognosis regarding the severity of pulmonary hypertension, newborn survival and the need for ECMO   She had an amniocentesis that was normal.  Fetal echo was also normal.  She reports no concerns today.  There is evidence of polyhydramnios which is likely due to impaired fetal swallowing due to the presence of the congenital diaphragmatic hernia.  She will continue weekly antenatal testing.  The plan is to deliver at [facility] to allow for postpartum surgery.   We briefly discussed the post delivery treatment of the newborn and the reason for delivery and at [facility] to allow for surgical treatment of the diaphragm defect. After birth typically the newborn is immediately intubated and bagged ventilation is avoided due to the possibility of gastric insufflation which would worsen lung compression.  Gastric decompression is also done to allow for expansion of lung volume.  Treatment of pulmonary hypertension with echocardiography is typically done.  ECMO is used  in refractory cases of hypoxemia or severe pulmonary hypertension.  Definitive surgical repair is delayed until the infant is medically stabilized (hemodynamically stable, pulmonary hypertension is treated and there is acceptable oxygenation) surgery is not emergent and does not treat the pulmonary hypoplasia.  The repair is typically a primary repair if the defect is small or a past repair for large defects in the diaphragm.  After repair there is gradual enteral feeding and continued treatment of pulmonary hypertension along with long-term follow-up to monitor lung function, feeding and surveillance for recurrent hernia or complications to the repair.  Recommendations - Continue antenatal testing and growth ultrasounds as scheduled - Continue LHR assessment as needed to predict  Standard OB precautions were given to the patient. The patient had time to ask questions that were answered to her satisfaction.  She verbalized understanding and agrees to proceed with the plan above.   I spent 40 minutes reviewing the patients chart, including labs and images as well as counseling the patient about her medical conditions. Greater than 50% of the time was spent in direct face-to-face patient counseling.  Delora Smaller  MFM, [place]   08/19/2024  9:47 AM   Review of Systems: A review of systems was performed and was negative except per HPI   Vitals and Physical Exam    08/19/2024    8:59 AM 08/09/2024    3:56 PM 07/14/2024    8:45 AM  Vitals with BMI  Weight  153 lbs 2 oz 147 lbs  Systolic 109 106 899  Diastolic 66 76 65  Pulse 81 91 80    Sitting comfortably on  the sonogram table Nonlabored breathing Normal rate and rhythm Abdomen is nontender  Past pregnancies OB History  Gravida Para Term Preterm AB Living  4 2 2  0 1 2  SAB IAB Ectopic Multiple Live Births  0 0 0 0 2    # Outcome Date GA Lbr Len/2nd Weight Sex Type Anes PTL Lv  4 Current           3 Term 12/12/22 [redacted]w[redacted]d /  00:52 7 lb 4.8 oz (3.31 kg) M Vag-Spont EPI  LIV  2 Term 12/28/18 [redacted]w[redacted]d 03:51 / 00:53 7 lb 3.7 oz (3.28 kg) M Vag-Spont EPI  LIV     Birth Comments: WNL  1 AB 2019 [redacted]w[redacted]d            Future Appointments  Date Time Provider Department Center  08/26/2024 11:15 AM Rudy Carlin LABOR, MD [facility] None      "

## 2024-08-26 ENCOUNTER — Encounter: Admitting: Obstetrics

## 2024-09-16 ENCOUNTER — Ambulatory Visit: Admitting: Obstetrics and Gynecology

## 2024-09-16 ENCOUNTER — Ambulatory Visit: Attending: Obstetrics and Gynecology

## 2024-09-16 VITALS — BP 120/69 | HR 86

## 2024-09-16 DIAGNOSIS — Z3A36 36 weeks gestation of pregnancy: Secondary | ICD-10-CM | POA: Diagnosis present

## 2024-09-16 DIAGNOSIS — O99891 Other specified diseases and conditions complicating pregnancy: Secondary | ICD-10-CM | POA: Insufficient documentation

## 2024-09-16 DIAGNOSIS — Q790 Congenital diaphragmatic hernia: Secondary | ICD-10-CM | POA: Diagnosis present

## 2024-09-16 DIAGNOSIS — O403XX0 Polyhydramnios, third trimester, not applicable or unspecified: Secondary | ICD-10-CM | POA: Diagnosis present

## 2024-09-16 DIAGNOSIS — O35FXX0 Maternal care for other (suspected) fetal abnormality and damage, fetal musculoskeletal anomalies of trunk, not applicable or unspecified: Secondary | ICD-10-CM | POA: Diagnosis present

## 2024-09-16 DIAGNOSIS — O35DXX0 Maternal care for other (suspected) fetal abnormality and damage, fetal gastrointestinal anomalies, not applicable or unspecified: Secondary | ICD-10-CM

## 2024-09-16 NOTE — Progress Notes (Signed)
 Maternal-Fetal Medicine Consultation  Name: Emily Wilkins  MRN: [id number]  GA: H5E7987 [redacted]w[redacted]d   Fetal congenital diaphragmatic hernia.   Normal fetal karyotype on amniocentesis. Genome did not show any abnormalities.  Patient had initiated her care with MFM at [facility] and will be delivering there. Plan is to deliver at 39-weeks' gestation.  Obstetrical history significant for 2 term vaginal deliveries.  Ultrasound Mild polyhydramnios seen.  Unstable lie with breech presentation at the end of scan.  Fetal growth is appropriate for gestational age.  Antenatal testing is reassuring.  BPP 8/8. Stomach is intra-abdominal.  The heart is displaced to the right and loops of bowel are seen in the left chest.  Lung head ratio is 1.42 and O/E LHR ([place]) is 43.2% indicating good prognosis and lower likelihood of having ECMO after birth.  I counseled the patient that prenatally predictions are less accurate and the need for ECMO will be known only after birth.  I counseled the patient of mild polyhydramnios and unstable lie.  Polyhydramnios can lead to preterm delivery and patient was advised to go to [place] if she has contractions. External cephalic version can be performed at [redacted] weeks gestation before induction of labor.  Recommendations - Patient has follow-up appointments at [facility]. - No follow-up appointments were made with us .     Consultation including face-to-face (more than 50%) counseling 20 minutes.

## 2024-09-26 ENCOUNTER — Encounter: Admitting: Obstetrics and Gynecology
